# Patient Record
Sex: Male | Born: 1995
Health system: Southern US, Community
[De-identification: ages and names within clinical notes are randomized; demographics above are authoritative.]

## PROBLEM LIST (undated history)

## (undated) DIAGNOSIS — J45909 Unspecified asthma, uncomplicated: Secondary | ICD-10-CM

## (undated) DIAGNOSIS — T7840XA Allergy, unspecified, initial encounter: Secondary | ICD-10-CM

## (undated) HISTORY — PX: CLEFT PALATE REPAIR: SUR1165

## (undated) HISTORY — PX: TYMPANOSTOMY TUBE PLACEMENT: SHX32

## (undated) HISTORY — DX: Unspecified asthma, uncomplicated: J45.909

## (undated) HISTORY — DX: Allergy, unspecified, initial encounter: T78.40XA

---
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
## 2014-03-03 ENCOUNTER — Ambulatory Visit (INDEPENDENT_AMBULATORY_CARE_PROVIDER_SITE_OTHER): Payer: 59 | Admitting: Family Medicine

## 2014-03-03 VITALS — BP 148/76 | HR 81 | Temp 98.1°F | Resp 20 | Ht 71.0 in | Wt 230.0 lb

## 2014-03-03 DIAGNOSIS — M25579 Pain in unspecified ankle and joints of unspecified foot: Secondary | ICD-10-CM

## 2014-03-03 DIAGNOSIS — M25572 Pain in left ankle and joints of left foot: Secondary | ICD-10-CM

## 2014-03-03 DIAGNOSIS — T148XXA Other injury of unspecified body region, initial encounter: Secondary | ICD-10-CM

## 2014-03-03 NOTE — Patient Instructions (Signed)
Acute Ankle Sprain with Phase II Rehab An acute ankle sprain is a partial or complete tear in one or more of the ligaments of the ankle due to traumatic injury. The severity of the injury depends on both the number of ligaments sprained and the grade of sprain. There are 3 grades of sprains.  A grade 1 sprain is a mild sprain. There is a slight pull without obvious tearing. There is no loss of strength, and the muscle and ligament are the correct length.  A grade 2 sprain is a moderate sprain. There is tearing of fibers within the substance of the ligament where it connects two bones or two cartilages. The length of the ligament is increased, and there is usually decreased strength.  A grade 3 sprain is a complete rupture of the ligament and is uncommon. In addition to the grade of sprain, there are 3 types of ankle sprains.  Lateral ankle sprains. This is a sprain of one or more of the 3 ligaments on the outer side (lateral) of the ankle. These are the most common sprains. Medial ankle sprains. There is one large triangular ligament on the inner side (medial) of the ankle that is susceptible to injury. Medial ankle sprains are less common. Syndesmosis, "high ankle," sprains. The syndesmosis is the ligament that connects the two bones of the lower leg. Syndesmosis sprains usually only occur with very severe ankle sprains. SYMPTOMS  Pain, tenderness, and swelling in the ankle, starting at the side of injury that may progress to the whole ankle and foot with time.  "Pop" or tearing sensation at the time of injury.  Bruising that may spread to the heel.  Impaired ability to walk soon after injury. CAUSES   Acute ankle sprains are caused by trauma placed on the ankle that temporarily forces or pries the anklebone (talus) out of its normal socket.  Stretching or tearing of the ligaments that normally hold the joint in place (usually due to a twisting injury). RISK INCREASES WITH:  Previous  ankle sprain.  Sports in which the foot may land awkwardly (basketball, volleyball, soccer) or walking or running on uneven or rough surfaces.  Shoes with inadequate support to prevent sideways motion when stress occurs.  Poor strength and flexibility.  Poor balance skills.  Contact sports. PREVENTION  Warm up and stretch properly before activity.  Maintain physical fitness:  Ankle and leg flexibility, muscle strength, and endurance.  Cardiovascular fitness.  Balance training activities.  Use proper technique and have a coach correct improper technique.  Taping, protective strapping, bracing, or high-top tennis shoes may help prevent injury. Initially, tape is best. However, it loses most of its support function within 10 to 15 minutes.  Wear proper fitted protective shoes. Combining high-top shoes with taping or bracing is more effective than using either alone.  Provide the ankle with support during sports and practice activities for 12 months following injury. PROGNOSIS   If treated properly, ankle sprains can be expected to recover completely. However, the length of recovery depends on the degree of injury.  A grade 1 sprain usually heals enough in 5 to 7 days to allow modified activity and requires an average of 6 weeks to heal completely.  A grade 2 sprain requires 6 to 10 weeks to heal completely.  A grade 3 sprain requires 12 to 16 weeks to heal.  A syndesmosis sprain often takes more than 3 months to heal. RELATED COMPLICATIONS   Frequent recurrence of symptoms may result   in a chronic problem. Appropriately addressing the problem the first time decreases the frequency of recurrence and optimizes healing time. Severity of initial sprain does not predict the likelihood of later instability.  Injury to other structures (bone, cartilage, or tendon).  Chronically unstable or arthritic ankle joint are possible with repeated sprains. TREATMENT Treatment initially  involves the use of ice, medicine, and compression bandages to help reduce pain and inflammation. Ankle sprains are usually immobilized in a walking cast or boot to allow for healing. Crutches may be recommended to reduce pressure on the injury. After immobilization, strengthening and stretching exercises may be necessary to regain strength and a full range of motion. Surgery is rarely needed to treat ankle sprains. MEDICATION   Nonsteroidal anti-inflammatory medicines, such as aspirin and ibuprofen (do not take for the first 3 days after injury or within 7 days before surgery), or other minor pain relievers, such as acetaminophen, are often recommended. Take these as directed by your caregiver. Contact your caregiver immediately if any bleeding, stomach upset, or signs of an allergic reaction occur from these medicines.  Ointments applied to the skin may be helpful.  Pain relievers may be prescribed as necessary by your caregiver. Do not take prescription pain medicine for longer than 4 to 7 days. Use only as directed and only as much as you need. HEAT AND COLD  Cold treatment (icing) is used to relieve pain and reduce inflammation for acute and chronic cases. Cold should be applied for 10 to 15 minutes every 2 to 3 hours for inflammation and pain and immediately after any activity that aggravates your symptoms. Use ice packs or an ice massage.  Heat treatment may be used before performing stretching and strengthening activities prescribed by your caregiver. Use a heat pack or a warm soak. SEEK IMMEDIATE MEDICAL CARE IF:   Pain, swelling, or bruising worsens despite treatment.  You experience pain, numbness, discoloration, or coldness in the foot or toes.  New, unexplained symptoms develop. (Drugs used in treatment may produce side effects.) EXERCISES  PHASE II EXERCISES RANGE OF MOTION (ROM) AND STRETCHING EXERCISES - Ankle Sprain, Acute-Phase II, Weeks 3 to 4 After your physician, physical  therapist, or athletic trainer feels your knee has made progress significant enough to begin more advanced exercises, he or she may recommend completing some of the following exercises. Although each person heals at different rates, most people will be ready for these exercises between 3 and 4 weeks after their injury. Do not begin these exercises until you have your caregiver's permission. He or she may also advise you to continue with the exercises which you completed in Phase I of your rehabilitation. While completing these exercises, remember:   Restoring tissue flexibility helps normal motion to return to the joints. This allows healthier, less painful movement and activity.  An effective stretch should be held for at least 30 seconds.  A stretch should never be painful. You should only feel a gentle lengthening or release in the stretched tissue. RANGE OF MOTION - Ankle Plantar Flexion   Sit with your right / left leg crossed over your opposite knee.  Use your opposite hand to pull the top of your foot and toes toward you.  You should feel a gentle stretch on the top of your foot/ankle. Hold this position for __________. Repeat __________ times. Complete __________ times per day.  RANGE OF MOTION - Ankle Eversion  Sit with your right / left ankle crossed over your opposite knee.    Grip your foot with your opposite hand, placing your thumb on the top of your foot and your fingers across the bottom of your foot.  Gently push your foot downward with a slight rotation so your littlest toes rise slightly  You should feel a gentle stretch on the inside of your ankle. Hold the stretch for __________ seconds. Repeat __________ times. Complete this exercise __________ times per day.  RANGE OF MOTION - Ankle Inversion  Sit with your right / left ankle crossed over your opposite knee.  Grip your foot with your opposite hand, placing your thumb on the bottom of your foot and your fingers across  the top of your foot.  Gently pull your foot so the smallest toe comes toward you and your thumb pushes the inside of the ball of your foot away from you.  You should feel a gentle stretch on the outside of your ankle. Hold the stretch for __________ seconds. Repeat __________ times. Complete this exercise __________ times per day.  STRETCH - Gastrocsoleus  Sit with your right / left leg extended. Holding onto both ends of a belt or towel, loop it around the ball of your foot.  Keeping your right / left ankle and foot relaxed and your knee straight, pull your foot and ankle toward you using the belt/towel.  You should feel a gentle stretch behind your calf or knee. Hold this position for __________ seconds. Repeat __________ times. Complete this stretch __________ times per day.  RANGE OF MOTION - Ankle Dorsiflexion, Active Assisted  Remove shoes and sit on a chair that is preferably not on a carpeted surface.  Place right / left foot under knee. Extend your opposite leg for support.  Keeping your heel down, slide your right / left foot back toward the chair until you feel a stretch at your ankle or calf. If you do not feel a stretch, slide your bottom forward to the edge of the chair while still keeping your heel down.  Hold this stretch for __________ seconds. Repeat __________ times. Complete this stretch __________ times per day.  STRETCH - Gastroc, Standing   Place hands on wall.  Extend right / left leg and place a folded washcloth under the arch of your foot for support. Keep the front knee somewhat bent.  Slightly point your toes inward on your back foot.  Keeping your right / left heel on the floor and your knee straight, shift your weight toward the wall, not allowing your back to arch.  You should feel a gentle stretch in the calf. Hold this position for __________ seconds. Repeat __________ times. Complete this stretch __________ times per day. STRETCH - Soleus,  Standing  Place hands on wall.  Extend right / left leg and place a folded washcloth under the arch of your foot for support. Keep the front knee somewhat bent.  Slightly point your toes inward on your back foot.  Keep your right / left heel on the floor, bend your back knee, and slightly shift your weight over the back leg so that you feel a gentle stretch deep in your back calf.  Hold this position for __________ seconds. Repeat __________ times. Complete this stretch __________ times per day. STRETCH - Gastrocsoleus, Standing Note: This exercise can place a lot of stress on your foot and ankle. Please complete this exercise only if specifically instructed by your caregiver.   Place the ball of your right / left foot on a step, keeping your other   foot firmly on the same step.  Hold on to the wall or a rail for balance.  Slowly lift your other foot, allowing your body weight to press your heel down over the edge of the step.  You should feel a stretch in your right / left calf.  Hold this position for __________ seconds.  Repeat this exercise with a slight bend in your knee. Repeat __________ times. Complete this stretch __________ times per day.  STRENGTHENING EXERCISES - Ankle Sprain, Acute-Phase II Around 3 to 4 weeks after your injury, you may progress to some of these exercises in your rehabilitation program. Do not begin these until you have your caregiver's permission. Although your condition has improved, the Phase I exercises will continue to be helpful and you may continue to complete them. As you complete strengthening exercises, remember:   Strong muscles with good endurance tolerate stress better.  Do the exercises as initially prescribed by your caregiver. Progress slowly with each exercise, gradually increasing the number of repetitions and weight used under his or her guidance.  You may experience muscle soreness or fatigue, but the pain or discomfort you are trying  to eliminate should never worsen during these exercises. If this pain does worsen, stop and make certain you are following the directions exactly. If the pain is still present after adjustments, discontinue the exercise until you can discuss the trouble with your caregiver. STRENGTH - Plantar-flexors, Standing  Stand with your feet shoulder width apart. Steady yourself with a wall or table using as little support as needed.  Keeping your weight evenly spread over the width of your feet, rise up on your toes.*  Hold this position for __________ seconds. Repeat __________ times. Complete this exercise __________ times per day.  *If this is too easy, shift your weight toward your right / left leg until you feel challenged. Ultimately, you may be asked to do this exercise with your right / left foot only. STRENGTH - Dorsiflexors and Plantar-flexors, Heel/toe Walking  Dorsiflexion: Walk on your heels only. Keep your toes as high as possible.  Walk for ____________________ seconds/feet.  Repeat __________ times. Complete __________ times per day.  Plantar flexion: Walk on your toes only. Keep your heels as high as possible.  Walk for ____________________ seconds/feet. Repeat __________ times. Complete __________ times per day.  BALANCE - Tandem Walking  Place your uninjured foot on a line 2 to 4 inches wide and at least 10 feet long.  Keeping your balance without using anything for extra support, place your right / left heel directly in front of your other foot.  Slowly raise your back foot up, lifting from the heel to the toes, and place it directly in front of the right / left foot.  Continue to walk along the line slowly. Walk for ____________________ feet. Repeat ____________________ times. Complete ____________________ times per day. BALANCE - Inversion/Eversion Use caution, these are advanced level exercises. Do not begin them until you are advised to do so.   Create a balance  board using a sturdy board about 1  feet long and at 1 to 1  feet wide and a 1  inch diameter rod or pipe that is as long as the board's width. A copper pipe or a solid broomstick work well.  Stand on a non-carpeted surface near a countertop or wall. Step onto the board so that your feet are hip-width apart and equally straddle the rod/pipe.  Keeping your feet in place, complete these two exercises   without shifting your upper body or hips:  Tip the board from side-to-side. Control the movement so the board does not forcefully strike the ground. The board should silently tap the ground.  Tip the board side-to-side without striking the ground. Occasionally pause and maintain a steady position at various points.  Repeat the first two exercises, but use only your right / left foot. Place your right / left foot directly over the rod/pipe. Repeat __________ times. Complete this exercise __________ times a day. BALANCE - Plantar/Dorsi Flexion Use caution, these are advanced level exercises. Do not begin them until you are advised to do so.   Create a balance board using a sturdy board about 1  feet long and at 1 to 1  feet wide and a 1  inch diameter rod or pipe that is as long as the board's width. A copper pipe or a solid broomstick work well.  Stand on a non-carpeted surface near a countertop or wall. Stand on the board so that the rod/pipe runs under the arches in your feet.  Keeping your feet in place, complete these two exercises without shifting your upper body or hips:  Tip the board from side-to-side. Control the movement so the board does not forcefully strike the ground. The board should silently tap the ground.  Tip the board side-to-side without striking the ground. Occasionally pause and maintain a steady position at various points.  Repeat the first two exercises, but use only your right / left foot. Stand in the center of the board. Repeat __________ times. Complete this  exercise __________ times a day. STRENGTH - Plantar-flexors, Eccentric Note: This exercise can place a lot of stress on your foot and ankle. Please complete this exercise only if specifically instructed by your caregiver.   Place the balls of your feet on a step. With your hands, use only enough support from a wall or rail to keep your balance.  Keep your knees straight and rise up on your toes.  Slowly shift your weight entirely to your toes and pick up your opposite foot. Gently and with controlled movement, lower your weight through your right / left foot so that your heel drops below the level of the step. You will feel a slight stretch in the back of your calf at the ending position.  Use the healthy leg to help rise up onto the balls of both feet, then lower weight only on the right / left leg again. Build up to 15 repetitions. Then progress to 3 consecutive sets of 15 repetitions.*  After completing the above exercise, complete the same exercise with a slight knee bend (about 30 degrees). Again, build up to 15 repetitions. Then progress to 3 consecutive sets of 15 repetitions.* Perform this exercise __________ times per day.  *When you easily complete 3 sets of 15, your physician, physical therapist, or athletic trainer may advise you to add resistance by wearing a backpack filled with additional weight. Document Released: 12/11/2005 Document Revised: 11/13/2011 Document Reviewed: 12/03/2008 [employer] Patient Information 2015 [employer]. This information is not intended to replace advice given to you by your health care provider. Make sure you discuss any questions you have with your health care provider.  

## 2014-03-03 NOTE — Progress Notes (Signed)
Chief Complaint:  Chief Complaint  Patient presents with  . Ankle Pain    Left ankle pain, came in a while back due to ankle pain, still having pain and discomfort in that ankle    HPI: Michael Santos is a 18 y.o. male who is here for left ankle pain, has intemittent pain on certain days. He has no numbness/weakness, tinglgin He has no fevers , chills, no current swelling, no current pain. He states that on days he overuses it the ankle will swell and hurt on the medial side. He had has this since his ankle sprain in Nov. The pain is not the same as it was then, just kind of achey if he uses it a lot. He wear high top boots   Foot and ankle xrays were both negative,   CLINICAL DATA: Left medial ankle and foot pain after twisting  injury  EXAM:  LEFT ANKLE COMPLETE - 3+ VIEW  COMPARISON: None.  FINDINGS:  There is no evidence of fracture, dislocation, or joint effusion.  There is no evidence of arthropathy or other focal bone abnormality.  Soft tissues are unremarkable.  IMPRESSION:  Negative.  Electronically Signed  By: Esperanza Heiraymond Rubner M.D.  On: 07/27/2013 17:36   CLINICAL DATA: Left medial ankle and foot pain after twisting  injury  EXAM:  LEFT FOOT - COMPLETE 3+ VIEW  COMPARISON: None.  FINDINGS:  There is no evidence of fracture or dislocation. There is no  evidence of arthropathy or other focal bone abnormality. Soft  tissues are unremarkable.  IMPRESSION:  Negative.  Electronically Signed  By: Esperanza Heiraymond Rubner M.D.  On: 07/27/2013 17:36    Past Medical History  Diagnosis Date  . Allergy   . Asthma    Past Surgical History  Procedure Laterality Date  . Cleft palate repair     History   Social History  . Marital Status: Single    Spouse Name: N/A    Number of Children: N/A  . Years of Education: N/A   Social History Main Topics  . Smoking status: Never Smoker   . Smokeless tobacco: None  . Alcohol Use: None  . Drug Use: None  . Sexual  Activity: None   Other Topics Concern  . None   Social History Narrative  . None   Family History  Problem Relation Age of Onset  . Cleft palate Father   . Cleft palate Paternal Grandfather    No Known Allergies Prior to Admission medications   Medication Sig Start Date End Date Taking? Authorizing Provider  albuterol (PROVENTIL HFA;VENTOLIN HFA) 108 (90 BASE) MCG/ACT inhaler Inhale 2 puffs into the lungs every 6 (six) hours as needed.   Yes Historical Provider, MD  Azelastine-Fluticasone (DYMISTA NA) Place into the nose.   Yes Historical Provider, MD  cetirizine (ZYRTEC) 10 MG tablet Take 10 mg by mouth daily.   Yes Historical Provider, MD  fluticasone-salmeterol (ADVAIR HFA) 115-21 MCG/ACT inhaler Inhale 2 puffs into the lungs 2 (two) times daily.   Yes Historical Provider, MD  ipratropium (ATROVENT) 0.03 % nasal spray Place 2 sprays into the nose 2 (two) times daily. 09/08/13  Yes Eleanore E Debbra RidingEgan, PA-C     ROS: The patient denies fevers, chills, night sweats, unintentional weight loss, chest pain, palpitations, wheezing, dyspnea on exertion, nausea, vomiting, abdominal pain, dysuria, hematuria, melena, numbness, weakness, or tingling.   All other systems have been reviewed and were otherwise negative with the exception of those mentioned  in the HPI and as above.    PHYSICAL EXAM: Filed Vitals:   03/03/14 0832  BP: 148/76  Pulse: 81  Temp: 98.1 F (36.7 C)  Resp: 20   Filed Vitals:   03/03/14 0832  Height: 5\' 11"  (1.803 m)  Weight: 230 lb (104.327 kg)   Body mass index is 32.09 kg/(m^2).  General: Alert, no acute distress HEENT:  Normocephalic, atraumatic, oropharynx patent. EOMI, PERRLA Cardiovascular:  Regular rate and rhythm, no rubs murmurs or gallops.  Radial pulse intact. No pedal edema.  Respiratory: Clear to auscultation bilaterally.  No wheezes, rales, or rhonchi.  No cyanosis, no use of accessory musculature GI: No organomegaly, abdomen is soft and  non-tender, positive bowel sounds.  No masses. Skin: No rashes. Neurologic: Facial musculature symmetric. Psychiatric: Patient is appropriate throughout our interaction. Lymphatic: No cervical lymphadenopathy Musculoskeletal: Gait intact. Left medial ankle-normal ankle, 5/5 str, sensation intact, neg anterior drawer.  + DP    LABS: No results found for this or any previous visit.   EKG/XRAY:   [facility]   ASSESSMENT/PLAN: Encounter Diagnoses  Name Primary?  . Pain in joint, ankle and foot, left Yes  . Sprain and strain    ROM exercises,a dvise to wear ace wrap or ankle brace prn Tylenol or motrin prin RICE after use prn Offered xrays but patient declined F/u prn  Gross sideeffects, risk and benefits, and alternatives of medications d/w patient. Patient is aware that all medications have potential sideeffects and we are unable to predict every sideeffect or drug-drug interaction that may occur.  Hamilton CapriLE, THAO PHUONG, DO 03/05/2014 3:40 PM

## 2014-03-27 ENCOUNTER — Ambulatory Visit (INDEPENDENT_AMBULATORY_CARE_PROVIDER_SITE_OTHER): Payer: 59 | Admitting: Family Medicine

## 2014-03-27 VITALS — BP 122/80 | HR 76 | Temp 98.5°F | Resp 16 | Ht 71.0 in | Wt 229.4 lb

## 2014-03-27 DIAGNOSIS — L600 Ingrowing nail: Secondary | ICD-10-CM

## 2014-03-27 MED ORDER — CEPHALEXIN 500 MG PO CAPS: 500.0000 mg | ORAL_CAPSULE | Freq: Three times a day (TID) | ORAL | Status: DC

## 2014-03-27 NOTE — Patient Instructions (Signed)
Use the keflex antibiotic as directed.  Continue to keep the ingrown edge free of the skin.  Let me know if you are not getting better- in that case we should have you come in for a partial nail resection.

## 2014-03-27 NOTE — Progress Notes (Signed)
[facility] [address], [place] [id number] [phone]  Date:  03/27/2014   Name:  Michael Santos   DOB:  [birth year]   MRN:  [id number]  PCP:  Tonye PearsonOLITTLE, ROBERT P, MD    Chief Complaint: Left Toe Infection   History of Present Illness:  Michael Santos is a 18 y.o. very pleasant male patient who presents with the following:  He is here today with a problem with his left great toe. He has had this issue for about 2 weeks. The nail seemed to get ingrown, and then got dirty from a pair of boots.  He was able to pull out the ingrown area, but the area still seems to be infected.  He gets this sort of problem on occasion, but usually self- resolves. However he feels that this time the infection is not going away.    Otherwise generally heatlhy and feeling well. No fever or other systemic sx.    Patient Active Problem List   Diagnosis Date Noted  . Allergic rhinitis 06/11/2013  . Reactive airway disease 06/11/2013    Past Medical History  Diagnosis Date  . Allergy   . Asthma     Past Surgical History  Procedure Laterality Date  . Cleft palate repair      History  Substance Use Topics  . Smoking status: Never Smoker   . Smokeless tobacco: Not on file  . Alcohol Use: Not on file    Family History  Problem Relation Age of Onset  . Cleft palate Father   . Cleft palate Paternal Grandfather     No Known Allergies  Medication list has been reviewed and updated.  Current Outpatient Prescriptions on File Prior to Visit  Medication Sig Dispense Refill  . albuterol (PROVENTIL HFA;VENTOLIN HFA) 108 (90 BASE) MCG/ACT inhaler Inhale 2 puffs into the lungs every 6 (six) hours as needed.      . Azelastine-Fluticasone (DYMISTA NA) Place into the nose.      . cetirizine (ZYRTEC) 10 MG tablet Take 10 mg by mouth daily.      . fluticasone-salmeterol (ADVAIR HFA) 115-21 MCG/ACT inhaler Inhale 2 puffs into the lungs 2 (two) times daily.      Marland Kitchen. ipratropium  (ATROVENT) 0.03 % nasal spray Place 2 sprays into the nose 2 (two) times daily.  30 mL  1   No current facility-administered medications on file prior to visit.    Review of Systems:  As per HPI- otherwise negative.   Physical Examination: Filed Vitals:   03/27/14 1110  BP: 122/80  Pulse: 76  Temp: 98.5 F (36.9 C)  Resp: 16   Filed Vitals:   03/27/14 1110  Height: 5\' 11"  (1.803 m)  Weight: 229 lb 6.4 oz (104.055 kg)   Body mass index is 32.01 kg/(m^2). Ideal Body Weight: Weight in (lb) to have BMI = 25: 178.9  GEN: WDWN, NAD, Non-toxic, A & O x 3, overweight, looks well HEENT: Atraumatic, Normocephalic. Neck supple. No masses, No LAD. Ears and Nose: No external deformity. CV: RRR, No M/G/R. No JVD. No thrill. No extra heart sounds. PULM: CTA B, no wheezes, crackles, rhonchi. No retractions. No resp. distress. No accessory muscle use. EXTR: No c/c/e NEURO Normal gait.  PSYCH: Normally interactive. Conversant. Not depressed or anxious appearing.  Calm demeanor.  Left great toenail: ingrown at the lateral edge. He has cut back the ingrown edge, but there is still a scant amount of purulent discharge  and tenderness   Assessment and Plan: Ingrown left big toenail - Plan: cephALEXin (KEFLEX) 500 MG capsule  Treat for infected nail with keflex- he will keep a close eye on this.  He does not prefer to have a wedge resection today, but will come back if not getting better   Signed Abbe Amsterdam, MD

## 2014-05-08 ENCOUNTER — Ambulatory Visit (INDEPENDENT_AMBULATORY_CARE_PROVIDER_SITE_OTHER): Payer: 59

## 2014-05-08 ENCOUNTER — Ambulatory Visit (INDEPENDENT_AMBULATORY_CARE_PROVIDER_SITE_OTHER): Payer: 59 | Admitting: Family Medicine

## 2014-05-08 VITALS — BP 146/76 | HR 80 | Temp 98.2°F | Resp 20 | Ht 70.75 in | Wt 235.8 lb

## 2014-05-08 DIAGNOSIS — M25571 Pain in right ankle and joints of right foot: Secondary | ICD-10-CM

## 2014-05-08 DIAGNOSIS — M25579 Pain in unspecified ankle and joints of unspecified foot: Secondary | ICD-10-CM

## 2014-05-08 DIAGNOSIS — M25572 Pain in left ankle and joints of left foot: Principal | ICD-10-CM

## 2014-05-08 DIAGNOSIS — S93409A Sprain of unspecified ligament of unspecified ankle, initial encounter: Secondary | ICD-10-CM

## 2014-05-08 DIAGNOSIS — S93402A Sprain of unspecified ligament of left ankle, initial encounter: Secondary | ICD-10-CM

## 2014-05-08 DIAGNOSIS — S93401A Sprain of unspecified ligament of right ankle, initial encounter: Secondary | ICD-10-CM

## 2014-05-08 DIAGNOSIS — L600 Ingrowing nail: Secondary | ICD-10-CM

## 2014-05-08 NOTE — Patient Instructions (Addendum)
Ibuprofen or alleve if needed, brace to right ankle as needed, walking boot on left until recheck in next week.  Out of braces for range of motion and early movement as below. Return to the clinic or go to the nearest emergency room if any of your symptoms worsen or new symptoms occur. Other information below to lessen ingrown toenail formation in the future.   INGROWN TOENAIL . Keep area clean, dry and bandaged for 24 hours. . After 24 hours, remove outer bandage and leave yellow gauze in place. Nuala Alpha toe/foot in warm soapy water for 5-10 minutes, once daily for 5 days. Rebandage toe after each cleaning. . Continue soaks until yellow gauze falls off. . Notify the office if you experience any of the following signs of infection: Swelling, redness, pus drainage, streaking, fever > 101.0 F

## 2014-05-08 NOTE — Progress Notes (Signed)
PROCEDURE: Verbal consent obtained. Digital block with 4 cc 2% lidocaine plain.  SP&D.  Lateral nail lifted and removed.  Xeroform placed.  Cleansed and dressed.

## 2014-05-08 NOTE — Progress Notes (Addendum)
Subjective:    Patient ID: Michael Santos, male    DOB: 02-17-1996, 18 y.o.   MRN: [id number]  This chart was scribed for Meredith Staggers, MD by Gwenevere Abbot, ED scribe. This patient was seen in room Room/bed 11 and the patient's care was started at 2:10 PM.  WUJ:[id number], Harrel Lemon, MD  Chief Complaint  Patient presents with  . Ankle Pain    twisted Both Ankle x 1 week ago  . Ingrown Toenail    Lt Great toe   HPI Michael Santos is a 18 y.o. male who presents to [facility] with bilateral ankle pain and ingrown toenail.  Pt has a h/o ankle pain and sprain in the past, seen most recently 03/03/2014. Also concerns of an ingrown toenail and was seen by Dr. Domenic Schwab on 03/27/2014. Pt was treated with Keflex, and advised to keep ingrown nail free of the skin as he declined a wedge resection that day.   Right ingrown toenail. Pt reports that he finished antibiotic, however it seemed it worsened again afterwards. Pt reports that he attempted to treat with antibiotic ointment. Pt reports that one of his boots may be one of the issues due to exposure to dirt and manuer.   Bilateral Ankles: Pt reports that he injured ankles 6 days ago, and inverted right ankle, then rolled left ankle after stepping off of something at house.     Patient Active Problem List   Diagnosis Date Noted  . Allergic rhinitis 06/11/2013  . Reactive airway disease 06/11/2013   Past Medical History  Diagnosis Date  . Allergy   . Asthma    Past Surgical History  Procedure Laterality Date  . Cleft palate repair     No Known Allergies Prior to Admission medications   Medication Sig Start Date End Date Taking? Authorizing Provider  albuterol (PROVENTIL HFA;VENTOLIN HFA) 108 (90 BASE) MCG/ACT inhaler Inhale 2 puffs into the lungs every 6 (six) hours as needed.   Yes Historical Provider, MD  Azelastine-Fluticasone (DYMISTA NA) Place into the nose.   Yes Historical Provider, MD  cetirizine (ZYRTEC) 10 MG tablet Take 10 mg  by mouth daily.   Yes Historical Provider, MD  fluticasone-salmeterol (ADVAIR HFA) 115-21 MCG/ACT inhaler Inhale 2 puffs into the lungs 2 (two) times daily.   Yes Historical Provider, MD  ibuprofen (ADVIL,MOTRIN) 800 MG tablet Take 800 mg by mouth every 8 (eight) hours as needed.   Yes Historical Provider, MD   History   Social History  . Marital Status: Single    Spouse Name: N/A    Number of Children: N/A  . Years of Education: N/A   Occupational History  . Not on file.   Social History Main Topics  . Smoking status: Never Smoker   . Smokeless tobacco: Not on file  . Alcohol Use: No  . Drug Use: No  . Sexual Activity: Not on file   Other Topics Concern  . Not on file   Social History Narrative  . No narrative on file    Review of Systems  Musculoskeletal: Positive for arthralgias and myalgias.  Skin:       Ingrown toenail       Objective:   Physical Exam  Nursing note and vitals reviewed. Constitutional: He is oriented to person, place, and time. He appears well-developed and well-nourished.  HENT:  Head: Normocephalic and atraumatic.  Eyes: EOM are normal.  Neck: Normal range of motion. Neck supple.  Cardiovascular: Normal rate.  Pulmonary/Chest: Effort normal.  Musculoskeletal: Normal range of motion.       Right ankle: Achilles tendon exhibits normal Thompson's test results.       Left ankle: Achilles tendon exhibits normal Thompson's test results.  Left Ankle: Fibula is non tender. Negative squeeze test. Complains of most pain on distal achilles of left ankle. Negative thompson test on left ankle. No step off of the achilles. No apparent defect. Calves are non tender. Tender on medial malleolus of left and lateral malleolus. Additionally tender on the anterior ankle. Slight soft tissue swelling laterally.Some laxity with drawer, and talar tilt testing  Right Ankle: Negative squeeze on lower leg. Fibula non tender. Medial malleolus tenderness. Full strength  and ROM. Tenderness with some soft tissue swelling laterally, slight laxity with talar tilt testing.   Able to dorsi and plantar flex. Able to evert and invert bilateral ankles. NVI distally.  Left Great Toe: Erythema, soft tissue swelling, with granulation tissue, at the medial nail fold. Some dried blood but no active discharge.   Neurological: He is alert and oriented to person, place, and time.  Skin: Skin is warm and dry.  Psychiatric: He has a normal mood and affect. His behavior is normal.    Imaging: Interpreted by Dr Neva Seat: Bilateral ankles are negative for fracture and no acute findings in both.   Filed Vitals:   05/08/14 1317  BP: 146/76  Pulse: 80  Temp: 98.2 F (36.8 C)  TempSrc: Oral  Resp: 20  Height: 5' 10.75" (1.797 m)  Weight: 235 lb 12.8 oz (106.958 kg)  SpO2: 100%       Assessment & Plan:   Michael Santos is a 18 y.o. male Bilateral ankle pain, Right ankle sprain, initial encounter, Left ankle sprain, initial encounter  -no apparent fx's. Left greater than right pain, into ankle.  Suspect some component of deconditioning. Cam walker on L, sweedo on right and recehck next week as somewhat guarded exam initially. ROM with ankle alphabet initially.   Ingrown toenail  -removed per procedure note and aftercare discussed. Discussed preventative techniques in future.   Meds ordered this encounter  Medications  . ibuprofen (ADVIL,MOTRIN) 800 MG tablet    Sig: Take 800 mg by mouth every 8 (eight) hours as needed.   Patient Instructions  Ibuprofen or alleve if needed, brace to right ankle as needed, walking boot on left until recheck in next week.  Out of braces for range of motion and early movement as below. Return to the clinic or go to the nearest emergency room if any of your symptoms worsen or new symptoms occur. Other information below to lessen ingrown toenail formation in the future.   INGROWN TOENAIL . Keep area clean, dry and bandaged for 24  hours. . After 24 hours, remove outer bandage and leave yellow gauze in place. Nuala Alpha toe/foot in warm soapy water for 5-10 minutes, once daily for 5 days. Rebandage toe after each cleaning. . Continue soaks until yellow gauze falls off. . Notify the office if you experience any of the following signs of infection: Swelling, redness, pus drainage, streaking, fever > 101.0 F       I personally performed the services described in this documentation, which was scribed in my presence. The recorded information has been reviewed and considered, and addended by me as needed.

## 2014-06-17 ENCOUNTER — Encounter: Payer: Self-pay | Admitting: Internal Medicine

## 2014-06-17 ENCOUNTER — Other Ambulatory Visit: Payer: Self-pay | Admitting: Internal Medicine

## 2014-06-17 DIAGNOSIS — H6691 Otitis media, unspecified, right ear: Secondary | ICD-10-CM

## 2014-06-17 MED ORDER — AMOXICILLIN 500 MG PO CAPS
1000.0000 mg | ORAL_CAPSULE | Freq: Two times a day (BID) | ORAL | Status: AC
Start: 2014-06-17 — End: 2014-06-27

## 2014-07-23 ENCOUNTER — Ambulatory Visit (INDEPENDENT_AMBULATORY_CARE_PROVIDER_SITE_OTHER): Payer: 59 | Admitting: Physician Assistant

## 2014-07-23 VITALS — BP 128/80 | HR 86 | Temp 98.5°F | Resp 18 | Wt 238.0 lb

## 2014-07-23 DIAGNOSIS — L600 Ingrowing nail: Secondary | ICD-10-CM

## 2014-07-23 NOTE — Progress Notes (Signed)
Subjective:    Patient ID: Michael Santos, male    DOB: 02/13/1996, 18 y.o.   MRN: [id number]  Chief Complaint  Patient presents with  . Ingrown Toenail    HPI 18 year old male is here for complaint of toe pain at his right foot at the 1st-3rd nails.  Patient has a hx of ingrown toenails that have been troublesome for months.  He usually relieves this by having his brother pull and clip the nails with nail tools.  However for the last 1-2 weeks the toes have extremely hurt.  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  He says that the 2nd toe has redness in the last week and is more pain ful than the others.  He denies fever, bleeding or pus.    Past Medical History  Diagnosis Date  . Allergy   . Asthma    Family History  Problem Relation Age of Onset  . Cleft palate Father   . Hyperlipidemia Father   . Hypertension Father   . Asthma Father   . COPD Father   . Cleft palate Paternal Grandfather   . Hypertension Mother   . Depression Mother    Past Surgical History  Procedure Laterality Date  . Cleft palate repair     Medications Current outpatient prescriptions: albuterol (PROVENTIL HFA;VENTOLIN HFA) 108 (90 BASE) MCG/ACT inhaler, Inhale 2 puffs into the lungs every 6 (six) hours as needed., Disp: , Rfl: ;  Azelastine-Fluticasone (DYMISTA NA), Place into the nose., Disp: , Rfl: ;  cetirizine (ZYRTEC) 10 MG tablet, Take 10 mg by mouth daily., Disp: , Rfl:  fluticasone-salmeterol (ADVAIR HFA) 115-21 MCG/ACT inhaler, Inhale 2 puffs into the lungs 2 (two) times daily., Disp: , Rfl: ;  ibuprofen (ADVIL,MOTRIN) 800 MG tablet, Take 800 mg by mouth every 8 (eight) hours as needed., Disp: , Rfl:   Review of Systems  Constitutional: Negative for fever.  Musculoskeletal: Negative for joint pain.        Objective:   Physical Exam  Constitutional: He is oriented to person, place, and time. He appears well-developed  and well-nourished. No distress.  BP 128/80 mmHg  Pulse 86  Temp(Src) 98.5 F (36.9 C) (Oral)  Resp 18  Wt 238 lb (107.956 kg)  SpO2 100%   Cardiovascular: Normal rate.   Pulmonary/Chest: Effort normal and breath sounds normal. No respiratory distress.  Musculoskeletal: He exhibits no edema.  Tenderness to palpaton of right foot at the lateral side of 1st toe along skin next to nail bed.  No erythema.  Cutting with sharp angle near proximal part of nail bed.  Tenderness to palpation. Right foot 3rd toe tender to palpation along lateral side of nail bed.  Cut on both sides of nail.  No erythema.   Neurological: He is alert and oriented to person, place, and time.  Skin: Skin is warm and dry. No rash noted. There is erythema (2nd toe has erythema at lateral side of nail bed.  Toe nail looks cut away from lateral side the toenail.  Can visualize sharp angle where it has been cut.  No drainage.  Tenderness to palpation.).  Psychiatric: He has a normal mood and affect. His behavior is normal. Judgment and thought content normal.  Nursing note and vitals reviewed.   Procedure: Consent obtained.  Injected 50/50 1%Lidocaine and 1%Marcaine for anesthesia.  Cleaned with Povidine.  Clipped lateral side of nail  bed of 1st and 2nd toe.  Swept underneath the cuticle and across nail bed to ensure entire nail was removed.  Placed xeroform across exposed nailbed of 1st and 2nd toe of the right foot.  Dressings applied.       Assessment & Plan:  18 year old with hx of ingrown toenails is here with complaint of toe pain at 1st through third nails.  These were ingrown toenails.  Clipped the aggravating sides to grow back smoothly, and reduce possibility of toenail growing into the toe skin.  Discussed that the patient not have the brother clip the nails and displayed how the toe would grow back into the skin in a more painful fashion.  Patient agreed, and will seek treatment if the toenail becomes painful again.     Ingrown right big toenail Ingrown toenail 1st and 2nd lateral portion of nail removed and xeroform placed.  These did not seem to be infected at this time to needing an antibiotic.  Instructed patient on care.   Patient will follow up if pain does not improve.    Trena PlattStephanie Norene Oliveri, PA-C [facility] 11/19/20159:50 PM

## 2014-07-23 NOTE — Patient Instructions (Signed)
Toenail Removal Toenails may need to be removed because of injury, infections, or to correct abnormal growth. A special non-stick bandage will likely be put tightly on your toe to prevent bleeding. Often times a new nail will grow back. Sometimes the new nail may be deformed. Most of the time when a nail is lost, it will gradually heal, but may be sensitive for a long time. HOME CARE INSTRUCTIONS   Keep your foot elevated to relieve pain and swelling. This will require lying in bed or on a couch with the leg on pillows or sitting in a recliner with the leg up. Walking or letting your leg dangle may increase swelling, slow healing, and cause throbbing pain.  Keep your bandage dry and clean.  Change your bandage in 24 hours.  After your bandage is changed, soak your foot in warm, soapy water for 10 to 20 minutes. Do this 3 times per day. This helps reduce pain and swelling. After soaking your foot apply a clean, dry bandage. Change your bandage if it is wet or dirty.  Only take over-the-counter or prescription medicines for pain, discomfort, or fever as directed by your caregiver.  See your caregiver as needed for problems. You might need a tetanus shot now if:  You have no idea when you had the last one.  You have never had a tetanus shot before.  The injured area had dirt in it. If you need a tetanus shot, and you decide not to get one, there is a rare chance of getting tetanus. Sickness from tetanus can be serious. If you did get a tetanus shot, your arm may swell, get red and warm to the touch at the shot site. This is common and not a problem. SEEK IMMEDIATE MEDICAL CARE IF:   You have increased pain, swelling, redness, warmth, drainage, or bleeding.  You have a fever.  You have swelling that spreads from your toe into your foot. Document Released: 05/20/2003 Document Revised: 11/13/2011 Document Reviewed: 08/31/2008 ExitCare Patient Information 2015 [employer]. This  information is not intended to replace advice given to you by your health care provider. Make sure you discuss any questions you have with your health care provider. 

## 2014-07-24 NOTE — Progress Notes (Signed)
I was directly involved in the patient's care and actively participated during the procedure.  

## 2014-09-07 ENCOUNTER — Ambulatory Visit (INDEPENDENT_AMBULATORY_CARE_PROVIDER_SITE_OTHER): Payer: 59 | Admitting: Family Medicine

## 2014-09-07 VITALS — BP 132/82 | HR 68 | Temp 98.2°F | Resp 17 | Ht 71.0 in | Wt 241.0 lb

## 2014-09-07 DIAGNOSIS — J0100 Acute maxillary sinusitis, unspecified: Secondary | ICD-10-CM

## 2014-09-07 DIAGNOSIS — J029 Acute pharyngitis, unspecified: Secondary | ICD-10-CM

## 2014-09-07 DIAGNOSIS — H9391 Unspecified disorder of right ear: Secondary | ICD-10-CM

## 2014-09-07 LAB — POCT RAPID STREP A (OFFICE): Rapid Strep A Screen: NEGATIVE

## 2014-09-07 MED ORDER — AMOXICILLIN 500 MG PO TABS: 500.0000 mg | ORAL_TABLET | Freq: Two times a day (BID) | ORAL | Status: DC

## 2014-09-07 NOTE — Progress Notes (Signed)
 Chief Complaint:  Chief Complaint  Patient presents with  . Cough  . Nasal Congestion  . Sore Throat    HPI: Michael Santos is a 19 y.o. male who is here for  Nasal congestion, sore throat and cough x 1 week. He had chills, has not had any SOB or wheezing that is acute. HE does have allergies and asthma which he takes medicine regualr for.  Has been taking meds regularly without changes in his sxs. . No ear pain but he does not feel much in his ears due to prior infectiosn and scarring. Facial pain and some HA   Past Medical History  Diagnosis Date  . Allergy   . Asthma    Past Surgical History  Procedure Laterality Date  . Cleft palate repair     History   Social History  . Marital Status: Single    Spouse Name: N/A    Number of Children: N/A  . Years of Education: N/A   Social History Main Topics  . Smoking status: Never Smoker   . Smokeless tobacco: None  . Alcohol Use: No  . Drug Use: No  . Sexual Activity: None   Other Topics Concern  . None   Social History Narrative   Family History  Problem Relation Age of Onset  . Cleft palate Father   . Hyperlipidemia Father   . Hypertension Father   . Asthma Father   . COPD Father   . Cleft palate Paternal Grandfather   . Hypertension Mother   . Depression Mother    No Known Allergies Prior to Admission medications   Medication Sig Start Date End Date Taking? Authorizing Provider  albuterol (PROVENTIL HFA;VENTOLIN HFA) 108 (90 BASE) MCG/ACT inhaler Inhale 2 puffs into the lungs every 6 (six) hours as needed.   Yes Historical Provider, MD  Azelastine-Fluticasone (DYMISTA NA) Place into the nose.   Yes Historical Provider, MD  cetirizine (ZYRTEC) 10 MG tablet Take 10 mg by mouth daily.   Yes Historical Provider, MD  fluticasone-salmeterol (ADVAIR HFA) 115-21 MCG/ACT inhaler Inhale 2 puffs into the lungs 2 (two) times daily.   Yes Historical Provider, MD  ibuprofen (ADVIL,MOTRIN) 800 MG tablet Take 800 mg  by mouth every 8 (eight) hours as needed.   Yes Historical Provider, MD     ROS: The patient denies fevers,  night sweats, unintentional weight loss, chest pain, palpitations, wheezing, dyspnea on exertion, nausea, vomiting, abdominal pain, dysuria, hematuria, melena, numbness, weakness, or tingling.   All other systems have been reviewed and were otherwise negative with the exception of those mentioned in the HPI and as above.    PHYSICAL EXAM: Filed Vitals:   09/07/14 1031  BP: 132/82  Pulse: 68  Temp: 98.2 F (36.8 C)  Resp: 17   Filed Vitals:   09/07/14 1031  Height:  (1.803 m)  Weight: 241 lb (109.317 kg)   Body mass index is 33.63 kg/(m^2).  General: Alert, no acute distress HEENT:  Normocephalic, atraumatic, oropharynx patent. EOMI, PERRLA.  +RIght Tm erythematous, and large right tonsil, no exudates. + erytheamtous sinus mucosa/boggy nares.  Cardiovascular:  Regular rate and rhythm, no rubs murmurs or gallops.  No Carotid bruits, radial pulse intact. No pedal edema.  Respiratory: Clear to auscultation bilaterally.  No wheezes, rales, or rhonchi.  No cyanosis, no use of accessory musculature GI: No organomegaly, abdomen is soft and non-tender, positive bowel sounds.  No masses. Skin: No rashes. Neurologic: Facial  musculature symmetric. Psychiatric: Patient is appropriate throughout our interaction. Lymphatic: No cervical lymphadenopathy Musculoskeletal: Gait intact.   LABS: Results for orders placed or performed in visit on 09/07/14  POCT rapid strep A  Result Value Ref Range   Rapid Strep A Screen Negative Negative     EKG/XRAY:   Primary read interpreted by Dr. Conley Rolls at [facility].   ASSESSMENT/PLAN: Encounter Diagnoses  Name Primary?  . Acute pharyngitis, unspecified pharyngitis type   . Acute maxillary sinusitis, recurrence not specified Yes  . Abnormal ear exam, right    Cont with allergy and asthma meds Rx Amoxacillin 500 mg BID x 10 days F/u  prn  Gross sideeffects, risk and benefits, and alternatives of medications d/w patient. Patient is aware that all medications have potential sideeffects and we are unable to predict every sideeffect or drug-drug interaction that may occur.  ,  PHUONG, DO 09/07/2014 11:45 AM

## 2014-09-07 NOTE — Patient Instructions (Signed)

## 2014-09-09 LAB — CULTURE, GROUP A STREP

## 2014-10-14 ENCOUNTER — Ambulatory Visit (INDEPENDENT_AMBULATORY_CARE_PROVIDER_SITE_OTHER): Payer: 59 | Admitting: Internal Medicine

## 2014-10-14 ENCOUNTER — Encounter: Payer: Self-pay | Admitting: Internal Medicine

## 2014-10-14 VITALS — BP 124/81 | HR 89 | Temp 97.9°F | Resp 16 | Ht 71.0 in | Wt 245.0 lb

## 2014-10-14 DIAGNOSIS — R635 Abnormal weight gain: Secondary | ICD-10-CM

## 2014-10-14 DIAGNOSIS — E162 Hypoglycemia, unspecified: Secondary | ICD-10-CM

## 2014-10-14 DIAGNOSIS — Z23 Encounter for immunization: Secondary | ICD-10-CM

## 2014-10-14 DIAGNOSIS — Z0000 Encounter for general adult medical examination without abnormal findings: Secondary | ICD-10-CM

## 2014-10-14 DIAGNOSIS — R42 Dizziness and giddiness: Secondary | ICD-10-CM

## 2014-10-14 DIAGNOSIS — L906 Striae atrophicae: Secondary | ICD-10-CM

## 2014-10-14 DIAGNOSIS — R799 Abnormal finding of blood chemistry, unspecified: Secondary | ICD-10-CM

## 2014-10-14 DIAGNOSIS — R7309 Other abnormal glucose: Secondary | ICD-10-CM

## 2014-10-14 LAB — CBC
HEMATOCRIT: 45.3 % (ref 39.0–52.0)
Hemoglobin: 15.5 g/dL (ref 13.0–17.0)
MCH: 28.4 pg (ref 26.0–34.0)
MCHC: 34.2 g/dL (ref 30.0–36.0)
MCV: 83.1 fL (ref 78.0–100.0)
MPV: 11.2 fL (ref 8.6–12.4)
Platelets: 191 10*3/uL (ref 150–400)
RBC: 5.45 MIL/uL (ref 4.22–5.81)
RDW: 13.2 % (ref 11.5–15.5)
WBC: 6.2 10*3/uL (ref 4.0–10.5)

## 2014-10-14 LAB — GLUCOSE, POCT (MANUAL RESULT ENTRY): POC GLUCOSE: 81 mg/dL (ref 70–99)

## 2014-10-14 NOTE — Patient Instructions (Signed)
We have ordered labs to address the low blood sugar.  In the meantime, please record whether you eat and what you eat during the day when these symptoms occur.  You can keep it on your i-phone--if these symptoms arise again.  Please eat balanced breakfasts, fresh fruits and green vegetables.   Keeping you healthy  Get these tests  Blood pressure- Have your blood pressure checked once a year by your healthcare provider.  Normal blood pressure is 120/80.  Weight- Have your body mass index (BMI) calculated to screen for obesity.  BMI is a measure of body fat based on height and weight. You can also calculate your own BMI at https://www.west-esparza.com/www.nhlbisupport.com/bmi/.  Cholesterol- Have your cholesterol checked regularly starting at age 20, sooner may be necessary if you have diabetes, high blood pressure, if a family member developed heart diseases at an early age or if you smoke.   Chlamydia, HIV, and other sexual transmitted disease- Get screened each year until the age of 20 then within three months of each new sexual partner.  Diabetes- Have your blood sugar checked regularly if you have high blood pressure, high cholesterol, a family history of diabetes or if you are overweight.  Get these vaccines  Flu shot- Every fall.  Tetanus shot- Every 10 years.  Menactra- Single dose; prevents meningitis.  Take these steps  Don't smoke- If you do smoke, ask your healthcare provider about quitting. For tips on how to quit, go to www.smokefree.gov or call 1-800-QUIT-NOW.  Be physically active- Exercise 5 days a week for at least 30 minutes.  If you are not already physically active start slow and gradually work up to 30 minutes of moderate physical activity.  Examples of moderate activity include walking briskly, mowing the yard, dancing, swimming bicycling, etc.  Eat a healthy diet- Eat a variety of healthy foods such as fruits, vegetables, low fat milk, low fat cheese, yogurt, lean meats, poultry, fish, beans,  tofu, etc.  For more information on healthy eating, go to www.thenutritionsource.org  Drink alcohol in moderation- Limit alcohol intake two drinks or less a day.  Never drink and drive.  Dentist- Brush and floss teeth twice daily; visit your dentis twice a year.  Depression-Your emotional health is as important as your physical health.  If you're feeling down, losing interest in things you normally enjoy please talk with your healthcare provider.  Gun Safety- If you keep a gun in your home, keep it unloaded and with the safety lock on.  Bullets should be stored separately.  Helmet use- Always wear a helmet when riding a motorcycle, bicycle, rollerblading or skateboarding.  Safe sex- If you may be exposed to a sexually transmitted infection, use a condom  Seat belts- Seat bels can save your life; always wear one.  Smoke/Carbon Monoxide detectors- These detectors need to be installed on the appropriate level of your home.  Replace batteries at least once a year.  Skin Cancer- When out in the sun, cover up and use sunscreen SPF 15 or higher.  Violence- If anyone is threatening or hurting you, please tell your healthcare provider.

## 2014-10-14 NOTE — Progress Notes (Signed)
[facility] [address] [place] [address] [phone]  Date:  10/14/2014   Name:  Michael Santos   DOB:  [birth year]   MRN:  [id number]  PCP:  Michael Pearson, MD    Chief Complaint: Annual Exam   History of Present Illness:  Michael Santos is a 19 y.o. very pleasant male patient with PMH of asthma and allergies, who is here today for a complete physical exam.  Patient states he has one concern of his blood glucose.  He states that he has events of dizziness, nausea, "grumpiness", and mouth tingling.  He checked his blood sugar at home when this occurs and it has been seen as low as 40.  This occurred once in the last 4-5 months.  He has checked it on different days which were around 60, and 70.  It has never been noted to be over 100 unless he eats which he checks 30 minutes after eating, which was 106.  The glucometer was from his father who has diabetes.  His father is on insulin.  He denies every attempting to use the insulin.  He denies tremulousness, no events of polyuria or polydipsia.   Patient has an erratic diet--eating spaghetti in the morning and not eating until dinner.  He eats fastfood heavy meals at times.  He works little hours putting up gutters.    Sleep: Sleeps 8-9 hrs/night without interruption, feeling refreshed.  Exercise: no exercise other than working  Patient has put up gutters for about 6 months.  He has plans to go to community college--study something with automotives.    Bowel movements: No constipation, diarrhea.  Two bowel movements .  No blood in stool.    Urination: No pain with urination.  No rashes.    Sexual Activity: Not currently sexually active.    Asthma: He was taking the albuterol 4x/day.  He does his dymista every day.  Coughing up yellow sputum over the last week.  Rarely ever using before one week.  Used it once yesterday, as he was engaging in outdoor activity.  No use today.  Breathing is fine today.  No  chest pain, palpitaitons, leg swelling.  Cough has improved from the illness.  He had fever during the first 3 days of illness, but this has since resolved.    Patient Active Problem List   Diagnosis Date Noted  . Allergic rhinitis 06/11/2013  . Reactive airway disease 06/11/2013    Past Medical History  Diagnosis Date  . Allergy   . Asthma     Past Surgical History  Procedure Laterality Date  . Cleft palate repair      History  Substance Use Topics  . Smoking status: Never Smoker   . Smokeless tobacco: Not on file  . Alcohol Use: No    Family History  Problem Relation Age of Onset  . Cleft palate Father   . Hyperlipidemia Father   . Hypertension Father   . Asthma Father   . COPD Father   . Cleft palate Paternal Grandfather   . Hypertension Mother   . Depression Mother        Autoimmune Hepatitis    Brother  No Known Allergies  Medication list has been reviewed and updated.  Current Outpatient Prescriptions on File Prior to Visit  Medication Sig Dispense Refill  . albuterol (PROVENTIL HFA;VENTOLIN HFA) 108 (90 BASE) MCG/ACT inhaler Inhale 2 puffs into the lungs every 6 (six) hours  as needed.    . Azelastine-Fluticasone (DYMISTA NA) Place into the nose.    . cetirizine (ZYRTEC) 10 MG tablet Take 10 mg by mouth daily.    . fluticasone-salmeterol (ADVAIR HFA) 115-21 MCG/ACT inhaler Inhale 2 puffs into the lungs 2 (two) times daily.    Marland Kitchen. ibuprofen (ADVIL,MOTRIN) 800 MG tablet Take 800 mg by mouth every 8 (eight) hours as needed.     No current facility-administered medications on file prior to visit.    Review of Systems: ROS otherwise unremarkable  Physical Examination: Filed Vitals:   10/14/14 1427  BP: 124/81  Pulse: 89  Temp: 97.9 F (36.6 C)  Resp: 16   Filed Vitals:   10/14/14 1427  Height: 5\' 11"  (1.803 m)  Weight: 245 lb (111.131 kg)   Body mass index is 34.19 kg/(m^2). Ideal Body Weight: Weight in (lb) to have BMI = 25: 178.9  Wt Readings  from Last 3 Encounters:  10/14/14 245 lb (111.131 kg) (99 %*, Z = 2.34)  09/07/14 241 lb (109.317 kg) (99 %*, Z = 2.28)  07/23/14 238 lb (107.956 kg) (99 %*, Z = 2.24)   * Growth percentiles are based on CDC 2-20 Years data.   Physical Exam  Constitutional: He is oriented to person, place, and time. He appears well-developed and well-nourished. No distress.  HENT:  Head: Normocephalic and atraumatic.  Right Ear: Tympanic membrane, external ear and ear canal normal.  Left Ear: Tympanic membrane, external ear and ear canal normal.  Nose: Septal deviation present. No mucosal edema or rhinorrhea. Right sinus exhibits no maxillary sinus tenderness and no frontal sinus tenderness. Left sinus exhibits no maxillary sinus tenderness and no frontal sinus tenderness.  Mouth/Throat: Posterior oropharyngeal edema (1+) present. No oropharyngeal exudate or posterior oropharyngeal erythema.  Eyes: Conjunctivae and EOM are normal. Pupils are equal, round, and reactive to light. Right eye exhibits no discharge. Left eye exhibits no discharge.  Neck: Normal range of motion. Neck supple. No thyromegaly present.  Cardiovascular: Normal rate, regular rhythm, normal heart sounds and intact distal pulses.  Exam reveals no friction rub.   No murmur heard. Pulmonary/Chest: Effort normal and breath sounds normal. No respiratory distress. He has no decreased breath sounds. He has no wheezes.  Abdominal: Soft. Bowel sounds are normal. He exhibits no mass. There is tenderness (Mild tenderness with deep palpation over LLQ, without mass.  ).  Vertical striae bilaterally along outter border of periumbilical quadrant.  Musculoskeletal: Normal range of motion.  Lymphadenopathy:    He has no cervical adenopathy.  Neurological: He is alert and oriented to person, place, and time. He has normal reflexes. No cranial nerve deficit. He displays a negative Romberg sign. Coordination normal.  Skin: Skin is warm and dry. No rash  noted. He is not diaphoretic. No erythema.  Psychiatric: He has a normal mood and affect. His behavior is normal.   Results for orders placed or performed in visit on 10/14/14  POCT glucose (manual entry)  Result Value Ref Range   POC Glucose 81 70 - 99 mg/dl     Assessment and Plan: 19 year old male is here today with PMH of asthma and allergies.  He is here today for complete physical exam and a concern of symptomatic hypoglycemia.  Annual physical exam  Need for immunization against influenza - Plan: Flu Vaccine QUAD 36+ mos IM (Fluarix)  Low glucose level - Plan: POCT glucose (manual entry), COMPLETE METABOLIC PANEL WITH GFR  Need for Tdap vaccination -  Plan: Tdap vaccine greater than or equal to 7yo IM  Striae - Plan: Cortisol -this could a poor diet with rapid wt gain..  Will  but rule out adrenal/hepatic/renal etiology.  Dizziness - Plan: CBC, COMPLETE METABOLIC PANEL WITH GFR, TSH  Weight gain - Plan: TSH  Trena Platt, PA-C [facility] 2/10/20169:47 PM  I have participated in the care of this patient with the Advanced Practice Provider and agree with Diagnosis and Plan as documented. Robert P. Merla Riches, M.D.

## 2014-10-15 LAB — CORTISOL: CORTISOL PLASMA: 6.5 ug/dL

## 2014-10-15 LAB — COMPLETE METABOLIC PANEL WITH GFR
ALBUMIN: 4.1 g/dL (ref 3.5–5.2)
ALT: 46 U/L (ref 0–53)
AST: 27 U/L (ref 0–37)
Alkaline Phosphatase: 130 U/L — ABNORMAL HIGH (ref 39–117)
BUN: 14 mg/dL (ref 6–23)
CALCIUM: 9.2 mg/dL (ref 8.4–10.5)
CHLORIDE: 103 meq/L (ref 96–112)
CO2: 26 meq/L (ref 19–32)
Creat: 0.95 mg/dL (ref 0.50–1.35)
GFR, Est African American: >89 mL/min
GLUCOSE: 82 mg/dL (ref 70–99)
POTASSIUM: 4.1 meq/L (ref 3.5–5.3)
Sodium: 138 mEq/L (ref 135–145)
Total Bilirubin: 0.8 mg/dL (ref 0.2–1.1)
Total Protein: 6.9 g/dL (ref 6.0–8.3)

## 2014-10-15 LAB — TSH: TSH: 3.089 u[IU]/mL (ref 0.350–4.500)

## 2014-10-25 ENCOUNTER — Encounter: Payer: Self-pay | Admitting: Internal Medicine

## 2015-03-06 ENCOUNTER — Ambulatory Visit (INDEPENDENT_AMBULATORY_CARE_PROVIDER_SITE_OTHER): Payer: 59 | Admitting: Emergency Medicine

## 2015-03-06 VITALS — BP 118/76 | HR 95 | Temp 98.4°F | Resp 17 | Ht 71.0 in | Wt 235.2 lb

## 2015-03-06 DIAGNOSIS — L600 Ingrowing nail: Secondary | ICD-10-CM

## 2015-03-06 DIAGNOSIS — M79674 Pain in right toe(s): Secondary | ICD-10-CM | POA: Diagnosis not present

## 2015-03-06 DIAGNOSIS — M79675 Pain in left toe(s): Secondary | ICD-10-CM | POA: Diagnosis not present

## 2015-03-06 MED ORDER — NAPROXEN 500 MG PO TABS: 500.0000 mg | ORAL_TABLET | Freq: Two times a day (BID) | ORAL | Status: DC

## 2015-03-06 NOTE — Progress Notes (Signed)
Verbal consent obtained.  Patient anesthetized via digital nerve block with 1:1 mix of 1% Lido without and 0.5% Marcaine at the great toe bilaterally.  Sterile pre and drape.  Right medial 1/3 toenail lifted and cut away.  Inspection reveals no retained toenail.  Left lateral toenail lifted and cut away.  Inspection reveals no retained toenail. Xeroform gauze applied bilaterally and clean dressing placed.  Patient advised to RTC as needed.  Wound care instructions provided. Deliah BostonMichael Chelcea Zahn, MS, PA-C   12:36 PM, 03/06/2015

## 2015-03-06 NOTE — Patient Instructions (Signed)

## 2015-03-06 NOTE — Progress Notes (Signed)
° °  Subjective:  This chart was scribed for [name], MD by [facility] Abu Hashem, medical scribe at [facility].The patient was seen in exam room 07 and the patient's care was started at 11:38 AM.   Patient ID: Michael Santos, male    DOB: 03-Jun-1996, 19 y.o.   MRN: [id number] Chief Complaint  Patient presents with   Ingrown Toenail    3 on right foot and 1 on left foot    HPI HPI Comments: Michael Santos is a 19 y.o. male who [facility] complaining of multiple ingrown toe nails, pt is unsure of the onset but he has had these in the past. He has one on each great toe. He was seen here for the same issue, the ingrown toenails were removed at that time. He has tried to remove the ingrown toe nails himself but was unsuccessful. He has never been seen by a podiatrist.   Review of Systems  Respiratory: Positive for shortness of breath.   Skin:       Positive for ingrown toe nail.      Objective:  BP 118/76 mmHg   Pulse 95   Temp(Src) 98.4 F (36.9 C) (Oral)   Resp 17   Ht 5\' 11"  (1.803 m)   Wt 235 lb 3.2 oz (106.686 kg)   BMI 32.82 kg/m2   SpO2 99% Physical Exam  Vitals reviewed. CONSTITUTIONAL: Well developed/well nourished HEAD: Normocephalic/atraumatic EYES: EOMI/PERRL ENMT: Mucous membranes moist NECK: supple no meningeal signs SPINE/BACK:entire spine nontender CV: S1/S2 noted, no murmurs/rubs/gallops noted LUNGS: Lungs are clear to auscultation bilaterally, no apparent distress ABDOMEN: soft, nontender, no rebound or guarding, bowel sounds noted throughout abdomen GU:no cva tenderness NEURO: Pt is awake/alert/appropriate, moves all extremitiesx4.  No facial droop.   EXTREMITIES: pulses normal/equal, full ROM SKIN: warm, color normal. Ingrown toe nail left foot of the greater toe that involves the soft tissue laterally. In grown toe nail on the right foot of the greater toe that involves the soft tissue medially. PSYCH: no  abnormalities of mood noted, alert and oriented to situation     Assessment & Plan:  I think hygiene is a major issue. Portions of both great toenails were removed. Hopefully this will give time for the soft tissue to heal around the nail plates.I personally performed the services described in this documentation, which was scribed in my presence. The recorded information has been reviewed and is accurate.  [name], MD

## 2015-04-02 ENCOUNTER — Ambulatory Visit (INDEPENDENT_AMBULATORY_CARE_PROVIDER_SITE_OTHER): Payer: 59

## 2015-04-02 ENCOUNTER — Ambulatory Visit (INDEPENDENT_AMBULATORY_CARE_PROVIDER_SITE_OTHER): Payer: 59 | Admitting: Family Medicine

## 2015-04-02 VITALS — BP 130/72 | HR 84 | Temp 98.6°F | Resp 18 | Ht 70.75 in | Wt 234.0 lb

## 2015-04-02 DIAGNOSIS — L600 Ingrowing nail: Secondary | ICD-10-CM | POA: Diagnosis not present

## 2015-04-02 DIAGNOSIS — R2231 Localized swelling, mass and lump, right upper limb: Secondary | ICD-10-CM

## 2015-04-02 DIAGNOSIS — R059 Cough, unspecified: Secondary | ICD-10-CM

## 2015-04-02 DIAGNOSIS — R05 Cough: Secondary | ICD-10-CM

## 2015-04-02 MED ORDER — DOXYCYCLINE HYCLATE 100 MG PO CAPS: 100.0000 mg | ORAL_CAPSULE | Freq: Two times a day (BID) | ORAL | Status: DC

## 2015-04-02 NOTE — Patient Instructions (Addendum)
INGROWN TOENAIL . Keep area clean, dry and bandaged for 24 hours. . After 24 hours, remove outer bandage and leave yellow gauze in place. Nuala Alpha toe/foot in warm soapy water for 5-10 minutes, once daily for 5 days. Rebandage toe after each cleaning. . Continue soaks until yellow gauze falls off. . Notify the office if you experience any of the following signs of infection: Swelling, redness, pus drainage, streaking, fever > 101.0 F  I will set you up for an ultrasound of the area under your arm We are going to use doxycycline (antibitoic) for you toe- this will also help with any infection in your lungs Let me know if your cough is not better soon   Try using some athlete's foot spray for your feet

## 2015-04-02 NOTE — Progress Notes (Signed)
[facility] [address] [place] [address] [phone]  Date:  04/02/2015   Name:  Michael Santos   DOB:  03-22-1996   MRN:  [id number]  PCP:  Tonye Pearson, MD    Chief Complaint: Mass; Shortness of Breath; and Ingrown Toenail   History of Present Illness:  Michael Santos is a 19 y.o. very pleasant male patient who presents with the following:  He is here today with a couple of concerns He has noted a lump under his right arm for about one month- it was just a bit tender, but hurts more over the last week or so. It may be getting a little bit bigger.  It has not drained.  He is getting concerned about this and would like to find out what it is.    He also noted a problem with his right great toenail- it has bothered him for some time. He had a medial wedge resection in the past, but now the lateral edge also seems to be red, inflamed, and infected,  He would like to have the entire nail removed if appropriate  He noted pain in his right chest when he takes a deep breath for about one month as well.  The pain is non- exertional.  He has had a little bit of a cough- non productive He has not noted a fever He does have a history of RAD and AR, uses albuterol as neededm adviar   Patient Active Problem List   Diagnosis Date Noted  . Allergic rhinitis 06/11/2013  . Reactive airway disease 06/11/2013    Past Medical History  Diagnosis Date  . Allergy   . Asthma     Past Surgical History  Procedure Laterality Date  . Cleft palate repair      History  Substance Use Topics  . Smoking status: Never Smoker   . Smokeless tobacco: Not on file  . Alcohol Use: No    Family History  Problem Relation Age of Onset  . Cleft palate Father   . Hyperlipidemia Father   . Hypertension Father   . Asthma Father   . COPD Father   . Cleft palate Paternal Grandfather   . Hypertension Mother   . Depression Mother     No Known  Allergies  Medication list has been reviewed and updated.  Current Outpatient Prescriptions on File Prior to Visit  Medication Sig Dispense Refill  . albuterol (PROVENTIL HFA;VENTOLIN HFA) 108 (90 BASE) MCG/ACT inhaler Inhale 2 puffs into the lungs every 6 (six) hours as needed.    . Azelastine-Fluticasone (DYMISTA NA) Place into the nose.    . cetirizine (ZYRTEC) 10 MG tablet Take 10 mg by mouth daily.    . fluticasone-salmeterol (ADVAIR HFA) 115-21 MCG/ACT inhaler Inhale 2 puffs into the lungs 2 (two) times daily.    Marland Kitchen ibuprofen (ADVIL,MOTRIN) 800 MG tablet Take 800 mg by mouth every 8 (eight) hours as needed.    . naproxen (NAPROSYN) 500 MG tablet Take 1 tablet (500 mg total) by mouth 2 (two) times daily with a meal. (Patient not taking: Reported on 04/02/2015) 30 tablet 0   No current facility-administered medications on file prior to visit.    Review of Systems:  As per HPI- otherwise negative.   Physical Examination: Filed Vitals:   04/02/15 1528  BP: 130/72  Pulse: 84  Temp: 98.6 F (37 C)  Resp: 18   Filed Vitals:   04/02/15 1528  Height: 5' 10.75" (1.797 m)  Weight: 234 lb (106.142 kg)   Body mass index is 32.87 kg/(m^2). Ideal Body Weight: Weight in (lb) to have BMI = 25: 177.6  GEN: WDWN, NAD, Non-toxic, A & O x 3, overweight, looks well.  History of cleft repair HEENT: Atraumatic, Normocephalic. Neck supple. No masses, No LAD.  Bilateral TM wnl, oropharynx normal.  PEERL,EOMI.   Ears and Nose: No external deformity. CV: RRR, No M/G/R. No JVD. No thrill. No extra heart sounds. PULM: CTA B, no wheezes, crackles, rhonchi. No retractions. No resp. distress. No accessory muscle use. EXTR: No c/c/e NEURO Normal gait.  PSYCH: Normally interactive. Conversant. Not depressed or anxious appearing.  Calm demeanor.  Right great toe shows lateral edge to be ingrown with scant purulent discharge, tenderness, and inflammation.  Noted tinea pedis There is a half baseball  sized mass under the right axilla, seems to be subcutaneous or deeper.  It is mobile and firm but not hard, suspect lipoma   [facility] reading (PRIMARY) by  Dr. Patsy Lager. CXR: negative CHEST 2 VIEW  COMPARISON: None.  FINDINGS: Normal mediastinum and cardiac silhouette. Normal pulmonary vasculature. No evidence of effusion, infiltrate, or pneumothorax. No acute bony abnormality.  IMPRESSION: No acute cardiopulmonary process. Normal chest radiograph.  Assessment and Plan: Cough - Plan: DG Chest 2 View, doxycycline (VIBRAMYCIN) 100 MG capsule  Mass of right axilla - Plan: [place] Misc Soft Tissue  Ingrown right big toenail - Plan: doxycycline (VIBRAMYCIN) 100 MG capsule  Ingrown nail treated as above Will arrange [place] for mass under his arm He has noted a cough for some time.  No wheezing or distress.  Plan to use doxycycline for his toenail anyway, will also treat any bronchitis that may be present.  He will let us know if not better soon  Signed Abbe Amsterdam, MD

## 2015-04-02 NOTE — Progress Notes (Signed)
Verbal consent obtained. Digital block with 4 cc 2% lidocaine plain.  SP&D.  Entire nail lifted and removed.  Xeroform placed.  Cleansed and dressed.  

## 2015-04-09 ENCOUNTER — Other Ambulatory Visit: Payer: Self-pay | Admitting: Family Medicine

## 2015-04-09 ENCOUNTER — Ambulatory Visit
Admission: RE | Admit: 2015-04-09 | Discharge: 2015-04-09 | Disposition: A | Discharge status: Home / Self Care | Payer: 59 | Source: Ambulatory Visit | Attending: Family Medicine | Admitting: Family Medicine

## 2015-04-09 DIAGNOSIS — R2231 Localized swelling, mass and lump, right upper limb: Secondary | ICD-10-CM

## 2015-04-12 ENCOUNTER — Other Ambulatory Visit: Payer: Self-pay | Admitting: Family Medicine

## 2015-04-12 ENCOUNTER — Telehealth: Payer: Self-pay | Admitting: Family Medicine

## 2015-04-12 DIAGNOSIS — R2231 Localized swelling, mass and lump, right upper limb: Secondary | ICD-10-CM

## 2015-04-12 NOTE — Telephone Encounter (Signed)
Patient states that he had a missed call. No VM was left but he thinks it may be about a biopsy. Please call him at [phone]

## 2015-04-12 NOTE — Progress Notes (Unsigned)
Called [facility] and arranged his needle bx.  Arrive at 1:40 this Thursday 8/11 pm for a [facility]pm appt [facility].  No heavy meal prior to coming in for test   [facility] ?  Address: [address], [place], [place] [address]  [phone]  Called and [facility] for him, will call back to make sure he gets information

## 2015-04-15 ENCOUNTER — Inpatient Hospital Stay: Admission: RE | Admit: 2015-04-15 | Payer: 59 | Source: Ambulatory Visit

## 2015-04-15 ENCOUNTER — Telehealth: Payer: Self-pay

## 2015-04-15 NOTE — Telephone Encounter (Signed)
Patient has a lump under his right arm pit and states he was to have a biopsy. He stated he received a call from our office on Monday 04/12/15. I cant tell who called him from our office. He stated he called back on Monday and has not had a return call. Please return patients call to [phone].

## 2015-04-16 NOTE — Telephone Encounter (Signed)
Called him back- unfortunately it looks like the VM I left him on Monday did not work and he missed his bx appt yesterday.  Gave him the number for the [place] [facility] and he will call today and reschedule this appt.  I will also call them and give them a heads up

## 2015-04-16 NOTE — Telephone Encounter (Signed)
Dr. Patsy Lager did you call pt?

## 2015-04-22 ENCOUNTER — Ambulatory Visit
Admission: RE | Admit: 2015-04-22 | Discharge: 2015-04-22 | Disposition: A | Discharge status: Home / Self Care | Payer: 59 | Source: Ambulatory Visit | Attending: Family Medicine | Admitting: Family Medicine

## 2015-04-22 ENCOUNTER — Other Ambulatory Visit: Payer: Self-pay | Admitting: Family Medicine

## 2015-04-22 ENCOUNTER — Other Ambulatory Visit: Payer: 59

## 2015-04-22 ENCOUNTER — Other Ambulatory Visit (HOSPITAL_COMMUNITY)
Admission: RE | Admit: 2015-04-22 | Discharge: 2015-04-22 | Disposition: A | Discharge status: Home / Self Care | Payer: 59 | Source: Ambulatory Visit | Attending: Diagnostic Radiology | Admitting: Diagnostic Radiology

## 2015-04-22 DIAGNOSIS — R2231 Localized swelling, mass and lump, right upper limb: Secondary | ICD-10-CM

## 2015-04-22 DIAGNOSIS — L02411 Cutaneous abscess of right axilla: Secondary | ICD-10-CM | POA: Diagnosis present

## 2015-04-25 LAB — CULTURE, ROUTINE-ABSCESS: Culture: NO GROWTH

## 2015-05-10 ENCOUNTER — Encounter: Payer: Self-pay | Admitting: Family Medicine

## 2015-06-29 ENCOUNTER — Ambulatory Visit (INDEPENDENT_AMBULATORY_CARE_PROVIDER_SITE_OTHER): Payer: 59 | Admitting: Physician Assistant

## 2015-06-29 VITALS — BP 118/86 | HR 85 | Temp 97.7°F | Resp 16 | Ht 70.0 in | Wt 241.2 lb

## 2015-06-29 DIAGNOSIS — H66001 Acute suppurative otitis media without spontaneous rupture of ear drum, right ear: Secondary | ICD-10-CM

## 2015-06-29 MED ORDER — AMOXICILLIN 875 MG PO TABS: 875.0000 mg | ORAL_TABLET | Freq: Two times a day (BID) | ORAL | Status: AC

## 2015-06-29 NOTE — Progress Notes (Signed)
   Michael Santos  MRN: [id number] DOB: [birth year]  Subjective:  Pt presents to clinic with right ear pain for the last 2 days which seems to be getting worse.  Then he woke up this am with other cold symptoms. Nasal congestion with yellow rhinorrhea and a dry cough.  He has asthma and takes his medication daily but he has had no problems with his asthma during this episode.  Home treatment - none Sick contact at home  Patient Active Problem List   Diagnosis Date Noted  . Allergic rhinitis 06/11/2013  . Reactive airway disease 06/11/2013    Current Outpatient Prescriptions on File Prior to Visit  Medication Sig Dispense Refill  . albuterol (PROVENTIL HFA;VENTOLIN HFA) 108 (90 BASE) MCG/ACT inhaler Inhale 2 puffs into the lungs every 6 (six) hours as needed.    . Azelastine-Fluticasone (DYMISTA NA) Place into the nose.    . cetirizine (ZYRTEC) 10 MG tablet Take 10 mg by mouth daily.    . fluticasone-salmeterol (ADVAIR HFA) 115-21 MCG/ACT inhaler Inhale 2 puffs into the lungs 2 (two) times daily.     No current facility-administered medications on file prior to visit.    No Known Allergies  Review of Systems  Constitutional: Negative for fever and chills.  HENT: Positive for congestion, ear pain (right) and rhinorrhea (yellow). Negative for ear discharge, hearing loss, postnasal drip and sore throat.   Respiratory: Positive for cough (dry).   Gastrointestinal: Negative for nausea, vomiting and diarrhea.  Allergic/Immunologic: Negative for environmental allergies.   Objective:  BP 118/86 mmHg  Pulse 85  Temp(Src) 97.7 F (36.5 C) (Oral)  Resp 16  Ht 5\' 10"  (1.778 m)  Wt 241 lb 3.2 oz (109.408 kg)  BMI 34.61 kg/m2  SpO2 99%  Physical Exam  Constitutional: He is oriented to person, place, and time and well-developed, well-nourished, and in no distress.     HENT:  Head: Normocephalic and atraumatic.  Right Ear: Hearing, external ear and ear canal normal. Tympanic  membrane is erythematous (mild) and bulging. Tympanic membrane is not perforated. A middle ear effusion (purulent fluid behind TM) is present.  Left Ear: Hearing, tympanic membrane, external ear and ear canal normal.  Nose: Nose normal.  Mouth/Throat: Uvula is midline, oropharynx is clear and moist and mucous membranes are normal.  Eyes: Conjunctivae are normal.  Neck: Normal range of motion.  Cardiovascular: Normal rate, regular rhythm and normal heart sounds.   Pulmonary/Chest: Effort normal and breath sounds normal. He has no wheezes.  Lymphadenopathy:       Head (right side): No tonsillar adenopathy present.       Head (left side): No tonsillar adenopathy present.    He has no cervical adenopathy.       Right: No supraclavicular adenopathy present.       Left: No supraclavicular adenopathy present.  Neurological: He is alert and oriented to person, place, and time. Gait normal.  Skin: Skin is warm and dry.  Psychiatric: Mood, memory, affect and judgment normal.    Assessment and Plan :  Acute suppurative otitis media of right ear without spontaneous rupture of tympanic membrane, recurrence not specified - Plan: amoxicillin (AMOXIL) 875 MG tablet   Symptomatic care for the cold symptoms was discussed with the patient.  Benny LennertSarah Weber PA-C  [facility] 06/29/2015 9:27 AM

## 2015-08-02 ENCOUNTER — Encounter: Payer: Self-pay | Admitting: Internal Medicine

## 2015-09-15 MED FILL — ADVAIR HFA 45-21 MCG INH: 45-21 | 30 days supply | Qty: 12 | Fill #5

## 2015-09-15 MED FILL — PROAIR HFA 90 MCG INHALER: 108 (90 BAS | 25 days supply | Qty: 9 | Fill #2

## 2015-10-23 ENCOUNTER — Emergency Department
Admission: EM | Admit: 2015-10-23 | Discharge: 2015-10-23 | Disposition: A | Discharge status: Home / Self Care | Payer: 59 | Source: Home / Self Care | Attending: Family Medicine | Admitting: Family Medicine

## 2015-10-23 ENCOUNTER — Encounter: Payer: Self-pay | Admitting: Emergency Medicine

## 2015-10-23 DIAGNOSIS — H6981 Other specified disorders of Eustachian tube, right ear: Secondary | ICD-10-CM

## 2015-10-23 DIAGNOSIS — J069 Acute upper respiratory infection, unspecified: Secondary | ICD-10-CM

## 2015-10-23 DIAGNOSIS — B9789 Other viral agents as the cause of diseases classified elsewhere: Principal | ICD-10-CM

## 2015-10-23 MED ORDER — PREDNISONE 20 MG PO TABS: 20.0000 mg | ORAL_TABLET | Freq: Two times a day (BID) | ORAL | Status: DC

## 2015-10-23 MED ORDER — BENZONATATE 200 MG PO CAPS: 200.0000 mg | ORAL_CAPSULE | Freq: Every day | ORAL | Status: DC

## 2015-10-23 MED ORDER — AMOXICILLIN 875 MG PO TABS: 875.0000 mg | ORAL_TABLET | Freq: Two times a day (BID) | ORAL | Status: DC

## 2015-10-23 NOTE — Discharge Instructions (Signed)
Take plain guaifenesin (  extended release tabs such as Mucinex) twice daily, with plenty of water, for cough and congestion.  May add Pseudoephedrine ( , one or two every 4 to 6 hours) for sinus congestion.  Get adequate rest.   May use Afrin nasal spray (or generic oxymetazoline) twice daily for about 5 days and then discontinue.  Also recommend using saline nasal spray several times daily and saline nasal irrigation (AYR is a common brand).  Use Flonase nasal spray each morning after using Afrin nasal spray and saline nasal irrigation. Try warm salt water gargles for sore throat.  Stop all antihistamines for now, and other non-prescription cough/cold preparations.  Continue Advair.  Use albuterol inhaler as needed.

## 2015-10-23 NOTE — ED Notes (Signed)
Pt c/o sore throat x 3 days, chest congestion, cough, runny nose.

## 2015-10-23 NOTE — ED Provider Notes (Signed)
CSN: [id number]     Arrival date & time 10/23/15  1001 History   First MD Initiated Contact with Patient 10/23/15 1120     Chief Complaint  Patient presents with  . URI     HPI Comments: Patient complains of three day history of typical cold-like symptoms including mild sore throat, sinus congestion, fatigue, and cough.   The history is provided by the patient.    Past Medical History  Diagnosis Date  . Allergy   . Asthma    Past Surgical History  Procedure Laterality Date  . Cleft palate repair    . Tympanostomy tube placement     Family History  Problem Relation Age of Onset  . Cleft palate Father   . Hyperlipidemia Father   . Hypertension Father   . Asthma Father   . COPD Father   . Cleft palate Paternal Grandfather   . Hypertension Mother   . Depression Mother    Social History  Substance Use Topics  . Smoking status: Never Smoker   . Smokeless tobacco: None  . Alcohol Use: No    Review of Systems + sore throat + cough No pleuritic pain + wheezing + nasal congestion + post-nasal drainage No sinus pain/pressure No itchy/red eyes No earache No hemoptysis No SOB No fever/chills No nausea No vomiting No abdominal pain No diarrhea No urinary symptoms No skin rash + fatigue No myalgias No headache Used OTC meds without relief  Allergies  Dust mite extract and Pollen extract  Home Medications   Prior to Admission medications   Medication Sig Start Date End Date Taking? Authorizing Provider  amoxicillin (AMOXIL) 875 MG tablet Take 1 tablet (875 mg total) by mouth 2 (two) times daily. 10/23/15   Lattie Haw, MD  benzonatate (TESSALON) 200 MG capsule Take 1 capsule (200 mg total) by mouth at bedtime. Take as needed for cough 10/23/15   Lattie Haw, MD  cetirizine (ZYRTEC) 10 MG tablet Take 10 mg by mouth daily.    Historical Provider, MD  fluticasone-salmeterol (ADVAIR HFA) 115-21 MCG/ACT inhaler Inhale 2 puffs into the lungs 2 (two) times  daily.    Historical Provider, MD  predniSONE (DELTASONE) 20 MG tablet Take 1 tablet (20 mg total) by mouth 2 (two) times daily. Take with food. 10/23/15   Lattie Haw, MD   Meds Ordered and Administered this Visit  Medications - No data to display  BP 131/82 mmHg  Pulse 91  Temp(Src) 98.2 F (36.8 C) (Oral)  Ht 6' (1.829 m)  Wt 245 lb 12 oz (111.471 kg)  BMI 33.32 kg/m2  SpO2 98% No data found.   Physical Exam Nursing notes and Vital Signs reviewed. Appearance:  Patient appears stated age, and in no acute distress Eyes:  Pupils are equal, round, and reactive to light and accomodation.  Extraocular movement is intact.  Conjunctivae are not inflamed  Ears:  Canals normal.  Right tympanic membrane slightly erythematous with decreased landmarks.  Nose:  Congested turbinates.  No sinus tenderness.    Pharynx:  Uvula mildly edematous Neck:  Supple.  Tender enlarged posterior nodes are palpated bilaterally  Lungs:  Clear to auscultation.  Breath sounds are equal.  Moving air well. Heart:  Regular rate and rhythm without murmurs, rubs, or gallops.  Abdomen:  Nontender without masses or hepatosplenomegaly.  Bowel sounds are present.  No CVA or flank tenderness.  Extremities:  No edema.  Skin:  No rash present.    ED  Course  Procedures none    Labs Reviewed -  POCT rapid strep test negative   Tympanogram:  Left ear high peak height;  Right ear wide, and negative peak pressure    MDM   1. Viral URI with cough   2. Eustachian tube dysfunction, right; suspect developing otitis media    Begin prednisone burst, and amoxicillin  BID. Prescription written for Benzonatate [facility]) to take at bedtime for night-time cough. Take plain guaifenesin (  extended release tabs such as Mucinex) twice daily, with plenty of water, for cough and congestion.  May add Pseudoephedrine ( , one or two every 4 to 6 hours) for sinus congestion.  Get adequate rest.   May use Afrin nasal  spray (or generic oxymetazoline) twice daily for about 5 days and then discontinue.  Also recommend using saline nasal spray several times daily and saline nasal irrigation (AYR is a common brand).  Use Flonase nasal spray each morning after using Afrin nasal spray and saline nasal irrigation. Try warm salt water gargles for sore throat.  Stop all antihistamines for now, and other non-prescription cough/cold preparations.  Continue Advair.  Use albuterol inhaler as needed. Followup with ENT if not improving 7 to 10 days.   Lattie Haw, MD 10/26/15 1110

## 2015-11-06 ENCOUNTER — Ambulatory Visit (INDEPENDENT_AMBULATORY_CARE_PROVIDER_SITE_OTHER): Payer: 59 | Admitting: Physician Assistant

## 2015-11-06 VITALS — BP 124/82 | HR 78 | Temp 97.9°F | Resp 18 | Wt 240.0 lb

## 2015-11-06 DIAGNOSIS — L600 Ingrowing nail: Secondary | ICD-10-CM

## 2015-11-06 DIAGNOSIS — J019 Acute sinusitis, unspecified: Secondary | ICD-10-CM | POA: Diagnosis not present

## 2015-11-06 MED ORDER — FLUTICASONE PROPIONATE 50 MCG/ACT NA SUSP: 2.0000 | Freq: Every day | NASAL | Status: DC

## 2015-11-06 MED ORDER — AMOXICILLIN-POT CLAVULANATE 875-125 MG PO TABS: 1.0000 | ORAL_TABLET | Freq: Two times a day (BID) | ORAL | Status: AC

## 2015-11-06 MED ORDER — GUAIFENESIN ER 1200 MG PO TB12: 1.0000 | ORAL_TABLET | Freq: Two times a day (BID) | ORAL | Status: DC | PRN

## 2015-11-06 NOTE — Progress Notes (Signed)
[facility] [address], [place] [address] [phone]  Date:  11/06/2015   Name:  Michael Santos   DOB:  May 30, 1996   MRN:  [id number]  PCP:  Tonye Pearson, MD    History of Present Illness:  Michael Santos is a 20 y.o. male patient who presents to [facility] for cc of toenail pain and upper respiratory symptoms. URsxs: Began with cold-like symptoms of congestion, cough, and sore throat for 1 week, and then in the last 4 days, he now has sinus pressure.  Mucus is a thick yellow brown mucus from nostril.  He has intermittent non-productive cough.  No fever.  He denies sob or dyspnea.  Nail pain: right 1st toe is painful at the nail bed.  He states that redness started to develop at the lateral side of his toe, where his nail was growing in.  He attempted to cut the distal part of the nail which helps the nail pain but this is still present.  He has had several nail removals in the past of his 1st toes.     Patient Active Problem List   Diagnosis Date Noted  . Allergic rhinitis 06/11/2013  . Reactive airway disease 06/11/2013    Past Medical History  Diagnosis Date  . Allergy   . Asthma     Past Surgical History  Procedure Laterality Date  . Cleft palate repair    . Tympanostomy tube placement      Social History  Substance Use Topics  . Smoking status: Never Smoker   . Smokeless tobacco: None  . Alcohol Use: No    Family History  Problem Relation Age of Onset  . Cleft palate Father   . Hyperlipidemia Father   . Hypertension Father   . Asthma Father   . COPD Father   . Cleft palate Paternal Grandfather   . Hypertension Mother   . Depression Mother     Allergies  Allergen Reactions  . Dust Mite Extract   . Pollen Extract     Medication list has been reviewed and updated.  Current Outpatient Prescriptions on File Prior to Visit  Medication Sig Dispense Refill  . cetirizine (ZYRTEC) 10 MG tablet Take 10 mg by mouth daily.    .  fluticasone-salmeterol (ADVAIR HFA) 115-21 MCG/ACT inhaler Inhale 2 puffs into the lungs 2 (two) times daily.    Marland Kitchen amoxicillin (AMOXIL) 875 MG tablet Take 1 tablet (875 mg total) by mouth 2 (two) times daily. (Patient not taking: Reported on 11/06/2015) 20 tablet 0  . benzonatate (TESSALON) 200 MG capsule Take 1 capsule (200 mg total) by mouth at bedtime. Take as needed for cough (Patient not taking: Reported on 11/06/2015) 12 capsule 0  . predniSONE (DELTASONE) 20 MG tablet Take 1 tablet (20 mg total) by mouth 2 (two) times daily. Take with food. (Patient not taking: Reported on 11/06/2015) 10 tablet 0   No current facility-administered medications on file prior to visit.    ROS ROS otherwise unremarkable unless listed above.   Physical Examination: BP 124/82 mmHg  Pulse 78  Temp(Src) 97.9 F (36.6 C) (Oral)  Resp 18  Wt 240 lb (108.863 kg)  SpO2 98% Ideal Body Weight:    Physical Exam  Constitutional: He is oriented to person, place, and time. He appears well-developed and well-nourished. No distress.  HENT:  Head: Atraumatic.  Right Ear: Tympanic membrane, external ear and ear canal normal.  Left Ear: Tympanic membrane,  external ear and ear canal normal.  Nose: Mucosal edema and rhinorrhea present. Right sinus exhibits maxillary sinus tenderness. Right sinus exhibits no frontal sinus tenderness. Left sinus exhibits maxillary sinus tenderness. Left sinus exhibits no frontal sinus tenderness.  Mouth/Throat: No uvula swelling. No oropharyngeal exudate, posterior oropharyngeal edema or posterior oropharyngeal erythema.  Eyes: Conjunctivae, EOM and lids are normal. Pupils are equal, round, and reactive to light. Right eye exhibits normal extraocular motion. Left eye exhibits normal extraocular motion.  Neck: Trachea normal and full passive range of motion without pain. No edema and no erythema present.  Cardiovascular: Normal rate and regular rhythm.   Pulmonary/Chest: Effort normal. No  respiratory distress. He has no decreased breath sounds. He has no wheezes. He has no rhonchi.  Lymphadenopathy:       Head (right side): No submandibular and no tonsillar adenopathy present.       Head (left side): No submandibular and no tonsillar adenopathy present.    He has no cervical adenopathy.  Neurological: He is alert and oriented to person, place, and time.  Skin: Skin is warm and dry. He is not diaphoretic.  Right toe with distal tenderness at the lateral side of the toe with palpation.  Erythema localized where nail was removed.  No purulent drainage.   Psychiatric: He has a normal mood and affect. His behavior is normal.   Procedure: verbal consent obtained.  Alcohol swabbed at injection site of right 1st digital block location.  .5% marcaine placed digital block.  Anesthesia obtained.  Povidine swabbed.  Lateral nail wedge removed without difficulty with nail cutter, and curved hemostats.  Cleansed with normal saline.  Xeroform placed over the nail matrix of missing lateral wedge.  Dressings applied.   Assessment and Plan: Michael Santos is a 20 y.o. male who is here today for sinus pressure, and toe nail pain of right 1st toe -removed without difficulty.  Advised wound care.  Alarming sxs discussed to warrant immediate return. -will treat with abx and supportive below.   -rtc if sxs persist. Subacute sinusitis, unspecified location - Plan: amoxicillin-clavulanate (AUGMENTIN) 875-125 MG tablet, fluticasone (FLONASE) 50 MCG/ACT nasal spray, Guaifenesin (MUCINEX MAXIMUM STRENGTH) 1200 MG TB12  Ingrown right big toenail  Trena PlattStephanie English, PA-C [facility] 3/4/20179:45 AM

## 2015-11-06 NOTE — Patient Instructions (Signed)
After 24-48 hours, soak the nail in water.  Replace the cloth with more of the xeroform.  Cover with bandaging.  Do this daily. Please take the augmentin for your sinus infection.  Make sure that you are hydrating well with 64 oz of water each day.  You have made great strides with the weight loss and dieting.  Keep up the good work!! Watch for alarming signs, regarding the nail, below.  Fingernail or Toenail Removal Fingernail or toenail removal is a surgical procedure to take off a nail from your finger or your toe. You may need to have a fingernail or toenail removed if it has an abnormal shape (deformity) or if it is severely injured. A fingernail or toenail may also be removed due to a bacterial infection, a severe ingrown toenail, or a fungal infection that has failed treatment with antifungal medicines. LET [facility] PROVIDER KNOW ABOUT:  Any allergies you have.  All medicines you are taking, including vitamins, herbs, eye drops, creams, and over-the-counter medicines.  Previous problems you or members of your family have had with the use of anesthetics.  Any blood disorders you have.  Previous surgeries you have had.  Any medical conditions you may have. RISKS AND COMPLICATIONS Generally, this is a safe procedure. However, problems may occur, including:  Pain.  Bleeding.  Infection.  Regrowth of a deformed nail. BEFORE THE PROCEDURE  Ask your health care provider about changing or stopping your regular medicines. This is especially important if you are taking diabetes medicines or blood thinners.  Follow instructions from your health care provider about eating or drinking restrictions.  Plan to have someone take you home after the procedure. PROCEDURE  An IV tube will be inserted into one of your veins.  You will be given one or more of the following:  A medicine that helps you relax (sedative).  A medicine that numbs the area (local anesthetic).  After  your toe or finger is numb, your health care provider will insert a blunt instrument under your nail to lift it up.  In some cases, your health care provider may also make a cut (incision) in your nail.  After your nail is lifted away from your toe or finger, your health care provider will detach it from your nail bed.  A germ-killing bandage (antiseptic dressing) will be put on your toe or finger. The procedure may vary among health care providers and hospitals. AFTER THE PROCEDURE  Your blood pressure, heart rate, breathing rate, and blood oxygen level will be monitored often until the medicines you were given have worn off.  It is common to have some pain after nail removal. You will be given pain medicine as needed.  You may be given a prescription for pain medicine and antibiotic medicine.  If you had a toenail removed, you will be given a surgical shoe to wear while you recover.  If you had a fingernail removed, you may be given a finger splint to wear while you recover.   This information is not intended to replace advice given to you by your health care provider. Make sure you discuss any questions you have with your health care provider.   Document Released: 05/20/2003 Document Revised: 01/05/2015 Document Reviewed: 08/19/2014 Elsevier Interactive Patient Education [employer]2016 Elsevier Inc.

## 2015-12-08 MED FILL — PROAIR HFA 90 MCG INHALER: 108 (90 BAS | 25 days supply | Qty: 9 | Fill #3

## 2015-12-16 ENCOUNTER — Other Ambulatory Visit: Payer: Self-pay

## 2015-12-16 MED ORDER — FLUTICASONE-SALMETEROL 115-21 MCG/ACT IN AERO: 2.0000 | INHALATION_SPRAY | Freq: Two times a day (BID) | RESPIRATORY_TRACT | Status: DC

## 2015-12-16 MED FILL — ADVAIR HFA 115-21 MCG INH: 115-21 | 30 days supply | Qty: 12 | Fill #0

## 2015-12-16 NOTE — Telephone Encounter (Signed)
Judeth CornfieldStephanie, pharm faxed req for Rx of Advair. I don't see that we have Rxd this for pt before although it was on pt's med list at his OV w/you last month. Discussed w/ Dr Merla Richesoolittle at CPE last year. Do you want to auth RF or does pt need to come in for eval first?

## 2016-01-12 ENCOUNTER — Ambulatory Visit (INDEPENDENT_AMBULATORY_CARE_PROVIDER_SITE_OTHER): Payer: 59

## 2016-01-12 ENCOUNTER — Ambulatory Visit (INDEPENDENT_AMBULATORY_CARE_PROVIDER_SITE_OTHER): Payer: 59 | Admitting: Physician Assistant

## 2016-01-12 VITALS — BP 140/80 | HR 82 | Temp 98.2°F | Resp 18 | Ht 72.0 in | Wt 243.0 lb

## 2016-01-12 DIAGNOSIS — M5442 Lumbago with sciatica, left side: Secondary | ICD-10-CM

## 2016-01-12 DIAGNOSIS — L600 Ingrowing nail: Secondary | ICD-10-CM

## 2016-01-12 DIAGNOSIS — M545 Low back pain: Secondary | ICD-10-CM | POA: Diagnosis not present

## 2016-01-12 NOTE — Patient Instructions (Addendum)
INGROWN TOENAIL . Keep area clean, dry and bandaged for 24 hours. . After 24 hours, remove outer bandage and leave yellow gauze in place. Michael Santos. Soak toe/foot in warm soapy water for 5-10 minutes, once daily for 5 days. Rebandage toe after each cleaning. . Continue soaks until yellow gauze falls off. . Notify the office if you experience any of the following signs of infection: Swelling, redness, pus drainage, streaking, fever > 101.0 F   You will get a phone call about making appt with podiatry and ortho    IF you received an x-ray today, you will receive an invoice from [facility]. Please contact [facility] at [phone] with questions or concerns regarding your invoice.   IF you received labwork today, you will receive an invoice from [facility]. Please contact [facility] at [phone] with questions or concerns regarding your invoice.   Our billing staff will not be able to assist you with questions regarding bills from these companies.  You will be contacted with the lab results as soon as they are available. The fastest way to get your results is to activate your My Chart account. Instructions are located on the last page of this paperwork. If you have not heard from us regarding the results in 2 weeks, please contact this office.

## 2016-01-12 NOTE — Progress Notes (Signed)
[facility] [address], [place] [address] [phone]  Date:  01/12/2016   Name:  Michael Santos   DOB:  [birth year]   MRN:  [id number]  PCP:  Tonye PearsonOLITTLE, ROBERT P, MD    Chief Complaint: Nail Problem and Back Pain   History of Present Illness:  This is a 20 y.o. male with PMH allergic rhinitis and RAD who is presenting with back pain and toe problems.  LBP x 5-6 years. Pain is midline, described as dull ache. Over the past 2 months gets occ sharp pain down left leg with bending/twisting motions. States yesterday he was bent over and when he stood up had the radiating pain and then started walking and left leg gave out - first time that had happened. Otherwise no leg weakness. No trouble with bowel or urinary incontinence. About 5 years ago he had xrays of his lower back - radiologist thought there were arthritic changes apparently. Went to see an orthopedist who states it "was just growing pains". Denies paresthesias. 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   Pt is also complaining of ingrown toenail on right 2nd toe. This is a recurrent issue for him. He was seen here two months ago and had right great toenail removed. He states he can't even count how many times he has had toenails removed. He states he never cuts his toenails -- nails reach a certain length and stop growing. States they all grow in in-grown.  Review of Systems:  Review of Systems See HPI  Patient Active Problem List   Diagnosis Date Noted  . Allergic rhinitis 06/11/2013  . Reactive airway disease 06/11/2013    Prior to Admission medications   Medication Sig Start Date End Date Taking? Authorizing Provider  cetirizine (ZYRTEC) 10 MG tablet Take 10 mg by mouth daily.   Yes Historical Provider, MD  fluticasone (FLONASE) 50 MCG/ACT nasal spray Place 2 sprays into both nostrils daily. 11/06/15  Yes Collie SiadStephanie D English, PA  fluticasone-salmeterol (ADVAIR HFA) 115-21 MCG/ACT inhaler Inhale 2 puffs into the  lungs 2 (two) times daily. 12/16/15  Yes Stephanie D English, PA  Guaifenesin (MUCINEX MAXIMUM STRENGTH) 1200 MG TB12 Take 1 tablet (1,200 mg total) by mouth every 12 (twelve) hours as needed. 11/06/15  Yes Collie SiadStephanie D English, PA    Allergies  Allergen Reactions  . Dust Mite Extract   . Pollen Extract     Past Surgical History  Procedure Laterality Date  . Cleft palate repair    . Tympanostomy tube placement      Social History  Substance Use Topics  . Smoking status: Never Smoker   . Smokeless tobacco: None  . Alcohol Use: No    Family History  Problem Relation Age of Onset  . Cleft palate Father   . Hyperlipidemia Father   . Hypertension Father   . Asthma Father   . COPD Father   . Cleft palate Paternal Grandfather   . Hypertension Mother   . Depression Mother     Medication list has been reviewed and updated.  Physical Examination:  Physical Exam  Constitutional: He is oriented to person, place, and time. He appears well-developed and well-nourished. No distress.  HENT:  Head: Normocephalic and atraumatic.  Right Ear: Hearing normal.  Left Ear: Hearing normal.  Nose: Nose normal.  Eyes: Conjunctivae and lids are normal. Right eye exhibits no discharge. Left eye exhibits no discharge. No scleral icterus.  Pulmonary/Chest: Effort normal. No respiratory distress.  Musculoskeletal:  Normal range of motion.       Lumbar back: He exhibits normal range of motion, no tenderness and no bony tenderness.  Straight leg raise negative  Neurological: He is alert and oriented to person, place, and time. He has normal strength and normal reflexes. No sensory deficit. Gait normal.  Skin: Skin is warm, dry and intact.  Right 2nd digit with right sided ingrown toe nail. Surrounding skin is erythematous. Purulence noted at cuticle.  Psychiatric: He has a normal mood and affect. His speech is normal and behavior is normal. Thought content normal.   BP 140/80 mmHg  Pulse 82   Temp(Src) 98.2 F (36.8 C) (Oral)  Resp 18  Ht 6' (1.829 m)  Wt 243 lb (110.224 kg)  BMI 32.95 kg/m2  SpO2 100%  Procedure: Verbal consent obtained. Skin was cleaned with alcohol. Digital block performed with 5 cc 1:1 prep 0.5% marcaine and 2% lido without epi on right 2nd toe. 2 betadine swabs used to clean toe. Sterile field applied. Right wedge excision performed. Phenol applied to nail bed. Xeroform placed on nailbed. Dressing placed and wound care discussed.  Dg Lumbar Spine Complete  01/12/2016  CLINICAL DATA:  Lower left side and back pain EXAM: LUMBAR SPINE - COMPLETE 4+ VIEW COMPARISON:  None. FINDINGS: There is no evidence of lumbar spine fracture. Alignment is normal. Intervertebral disc spaces are maintained. Bilateral renal calcifications are identified. IMPRESSION: 1. Normal appearance of the lumbar spine. 2. Bilateral nephrolithiasis. Electronically Signed   By: Signa Kell M.D.   On: 01/12/2016 09:10    Assessment and Plan:  1. Midline low back pain with left-sided sciatica Radiograph negative except for finding incidental kidney stones. Referred to ortho -- likely will need MRI to further evaluate. - DG Lumbar Spine Complete; Future - Ambulatory referral to Orthopedic Surgery  2. Ingrown toenail Right 2nd toenail wedge excision performed. Phenol applied to nail bed to prevent regrowth. Referred to podiatry for further eval d/t recurrent ingrown toenails. - Ambulatory referral to Podiatry   Roswell Miners. Dyke Brackett, MHS [facility]  01/12/2016

## 2016-01-25 DIAGNOSIS — M545 Low back pain: Secondary | ICD-10-CM | POA: Diagnosis not present

## 2016-01-26 ENCOUNTER — Encounter: Payer: Self-pay | Admitting: Podiatry

## 2016-01-26 ENCOUNTER — Ambulatory Visit (INDEPENDENT_AMBULATORY_CARE_PROVIDER_SITE_OTHER): Payer: 59 | Admitting: Podiatry

## 2016-01-26 VITALS — BP 132/80 | HR 86 | Resp 12 | Ht 72.0 in | Wt 232.0 lb

## 2016-01-26 DIAGNOSIS — L600 Ingrowing nail: Secondary | ICD-10-CM | POA: Diagnosis not present

## 2016-01-26 NOTE — Patient Instructions (Signed)

## 2016-01-26 NOTE — Progress Notes (Signed)
   Subjective:    Patient ID: Michael Santos, male    DOB: February 09, 1996, 20 y.o.   MRN: [id number]  HPI "I've had multiple ingrown toenails.  I've had several of them removed before but they seem to grown back in.  I try to pull them out when they grow in.  They're ingrown on both feet.  I just recently had the one on the right foot done."    Review of Systems  Respiratory:       Asthma  Musculoskeletal: Positive for myalgias.  Allergic/Immunologic: Positive for environmental allergies.  All other systems reviewed and are negative.      Objective:   Physical Exam        Assessment & Plan:

## 2016-01-27 NOTE — Progress Notes (Signed)
Subjective:     Patient ID: Michael Santos, male   DOB: Oct 03, 1995, 20 y.o.   MRN: [id number]  HPI patient states I've had a lot of problems with my nails and I've had the hallux worked on both feet second right with continued problems second right and not sure if I will develop ingrown's on the remaining nails   Review of Systems  All other systems reviewed and are negative.      Objective:   Physical Exam  Constitutional: He is oriented to person, place, and time.  Cardiovascular: Intact distal pulses.   Musculoskeletal: Normal range of motion.  Neurological: He is oriented to person, place, and time.  Skin: Skin is warm.  Nursing note and vitals reviewed.  neurovascular status intact muscle strength adequate range of motion within normal limits with patient found to have incurvated lateral border right second toe and thickness of the hallux nail right and damage to the hallux nail left but nontender. Patient's found have good digital perfusion and well oriented 3     Assessment:     Ingrown toenail deformity second toe right lateral border and damaged hallux nails bilateral    Plan:     H&P and conditions reviewed with patient. We're to focus just on the second which is been giving him trouble and I infiltrated the right second digit 60 [place]igan second marking mixture remove the lateral border exposed matrix and applied phenol 3 applications 30 seconds followed by alcohol lavage. May require work on the big toenails but we'll depending on how it responds

## 2016-01-30 ENCOUNTER — Telehealth: Payer: 59 | Admitting: Family

## 2016-01-30 DIAGNOSIS — J329 Chronic sinusitis, unspecified: Secondary | ICD-10-CM

## 2016-01-30 DIAGNOSIS — A499 Bacterial infection, unspecified: Secondary | ICD-10-CM | POA: Diagnosis not present

## 2016-01-30 DIAGNOSIS — B9689 Other specified bacterial agents as the cause of diseases classified elsewhere: Secondary | ICD-10-CM

## 2016-01-30 MED ORDER — AMOXICILLIN-POT CLAVULANATE 875-125 MG PO TABS: 1.0000 | ORAL_TABLET | Freq: Two times a day (BID) | ORAL | Status: AC

## 2016-01-30 NOTE — Progress Notes (Signed)

## 2016-02-09 ENCOUNTER — Telehealth: Payer: Self-pay | Admitting: *Deleted

## 2016-02-09 NOTE — Telephone Encounter (Signed)
Called patient at [phone] (Cell #) to check to see how they were doing from their ingrown toenail procedure that was performed on Wednesday, Jan 26, 2016. Pt stated, "Doing okay and has no pain".

## 2016-03-09 ENCOUNTER — Telehealth: Payer: 59 | Admitting: Physician Assistant

## 2016-03-09 DIAGNOSIS — R21 Rash and other nonspecific skin eruption: Secondary | ICD-10-CM | POA: Diagnosis not present

## 2016-03-09 MED ORDER — PREDNISONE 10 MG PO TABS: 10.0000 mg | ORAL_TABLET | Freq: Every day | ORAL | Status: DC

## 2016-03-09 NOTE — Progress Notes (Signed)
E Visit for Rash  We are sorry that you are not feeling well. Here is how we plan to help!  Based on what you shared with me it looks like you have an allergic dermatitis.  Contact dermatitis is a skin rash caused by something that touches the skin and causes irritation or inflammation.  Your skin may be red, swollen, dry, cracked, and itch.  The rash should go away in a few days but can last a few weeks.  If you get a rash, it's important to figure out what caused it so the irritant can be avoided in the future. and I have prescribed Prednisone 10 mg daily for 5 days  HOME CARE:   Take cool showers and avoid direct sunlight.  Apply cool compress or wet dressings.  Take a bath in an oatmeal bath.  Sprinkle content of one Aveeno packet under running faucet with comfortably warm water.  Bathe for 15-20 minutes, 1-2 times daily.  Pat dry with a towel. Do not rub the rash.  Use hydrocortisone cream.  Take an antihistamine like Benadryl for widespread rashes that itch.  The adult dose of Benadryl is 25-50 mg by mouth 4 times daily.  Caution:  This type of medication may cause sleepiness.  Do not drink alcohol, drive, or operate dangerous machinery while taking antihistamines.  Do not take these medications if you have prostate enlargement.  Read package instructions thoroughly on all medications that you take.  GET HELP RIGHT AWAY IF:   Symptoms don't go away after treatment.  Severe itching that persists.  If you rash spreads or swells.  If you rash begins to smell.  If it blisters and opens or develops a yellow-brown crust.  You develop a fever.  You have a sore throat.  You become short of breath.  MAKE SURE YOU:  Understand these instructions. Will watch your condition. Will get help right away if you are not doing well or get worse.  Thank you for choosing an e-visit. Your e-visit answers were reviewed by a board certified advanced clinical practitioner to complete your  personal care plan. Depending upon the condition, your plan could have included both over the counter or prescription medications. Please review your pharmacy choice. Be sure that the pharmacy you have chosen is open so that you can pick up your prescription now.  If there is a problem you may message your provider in MyChart to have the prescription routed to another pharmacy. Your safety is important to us. If you have drug allergies check your prescription carefully.  For the next 24 hours, you can use MyChart to ask questions about today's visit, request a non-urgent call back, or ask for a work or school excuse from your e-visit provider. You will get an email in the next two days asking about your experience. I hope that your e-visit has been valuable and will speed your recovery.

## 2016-03-23 DIAGNOSIS — H5213 Myopia, bilateral: Secondary | ICD-10-CM | POA: Diagnosis not present

## 2016-03-23 DIAGNOSIS — H527 Unspecified disorder of refraction: Secondary | ICD-10-CM | POA: Diagnosis not present

## 2016-04-03 MED FILL — ADVAIR HFA 115-21 MCG INH: 115-21 | 30 days supply | Qty: 12 | Fill #1

## 2016-05-10 ENCOUNTER — Encounter: Payer: Self-pay | Admitting: Podiatry

## 2016-05-10 ENCOUNTER — Ambulatory Visit (INDEPENDENT_AMBULATORY_CARE_PROVIDER_SITE_OTHER): Payer: 59 | Admitting: Podiatry

## 2016-05-10 DIAGNOSIS — L600 Ingrowing nail: Secondary | ICD-10-CM

## 2016-05-10 NOTE — Patient Instructions (Signed)

## 2016-05-11 NOTE — Progress Notes (Signed)
Subjective:     Patient ID: Michael Santos, male   DOB: 15-Sep-1995, 20 y.o.   MRN: [id number]  HPI patient presents with painful ingrown toenail left big toe of chronic nature   Review of Systems     Objective:   Physical Exam Neurovascular status intact with incurvated left hallux medial lateral border that's painful when pressed with redness and has tried to trim it and soak it    Assessment:     Chronic ingrown toenail deformity left hallux    Plan:     Reviewed and recommended removal of the corners and explained procedure. Today I infiltrated the hallux 60 Milligan times like Marcaine mixture remove the corners exposed matrix and applied phenol 3 applications 30 seconds followed by alcohol lavage and sterile dressing. Gave instructions on soaks and reappoint

## 2016-06-07 MED FILL — ADVAIR HFA 115-21 MCG INH: 115-21 | 30 days supply | Qty: 12 | Fill #2

## 2016-08-03 ENCOUNTER — Encounter: Payer: Self-pay | Admitting: Osteopathic Medicine

## 2016-08-03 ENCOUNTER — Ambulatory Visit (INDEPENDENT_AMBULATORY_CARE_PROVIDER_SITE_OTHER): Payer: 59 | Admitting: Osteopathic Medicine

## 2016-08-03 VITALS — BP 136/76 | HR 71 | Ht 72.0 in | Wt 236.0 lb

## 2016-08-03 DIAGNOSIS — Z8669 Personal history of other diseases of the nervous system and sense organs: Secondary | ICD-10-CM | POA: Insufficient documentation

## 2016-08-03 DIAGNOSIS — J4540 Moderate persistent asthma, uncomplicated: Secondary | ICD-10-CM | POA: Diagnosis not present

## 2016-08-03 DIAGNOSIS — Z23 Encounter for immunization: Secondary | ICD-10-CM

## 2016-08-03 DIAGNOSIS — J309 Allergic rhinitis, unspecified: Secondary | ICD-10-CM

## 2016-08-03 DIAGNOSIS — Z8249 Family history of ischemic heart disease and other diseases of the circulatory system: Secondary | ICD-10-CM

## 2016-08-03 DIAGNOSIS — Z87730 Personal history of (corrected) cleft lip and palate: Secondary | ICD-10-CM

## 2016-08-03 NOTE — Progress Notes (Signed)
HPI: Michael Santos is a 20 y.o. male  who [facility] today, 08/03/16,  for chief complaint of:  Chief Complaint  Patient presents with  . Establish Care    Will be starting school and requests vaccination records. Vaccine records printed from state database, up-to-date on required vaccines.  Asthma: stable on Advair. Has not needed rescue inhaler in two months. Does not need refills at this moment that he is aware of  Allergic rhinitis: controlled with Zyrtec and Flonase.  Previous medical history reviewed as below, pertinent information includes father with heart attack/heart disease at young age, may have been substance abuse involved but patient is not sure, that apparently had several MIs.   Past medical, surgical, social and family history reviewed: Patient Active Problem List   Diagnosis Date Noted  . History of chronic ear infection 08/03/2016  . Allergic rhinitis 06/11/2013  . Reactive airway disease 06/11/2013   Past Surgical History:  Procedure Laterality Date  . CLEFT PALATE REPAIR    . TYMPANOSTOMY TUBE PLACEMENT     Social History  Substance Use Topics  . Smoking status: Never Smoker  . Smokeless tobacco: Never Used  . Alcohol use No   Family History  Problem Relation Age of Onset  . Cleft palate Father   . Hyperlipidemia Father   . Hypertension Father   . Asthma Father   . Alcohol abuse Father   . Diabetes Father   . COPD Father   . Hypertension Mother   . Depression Mother   . Cleft palate Paternal Grandfather   . Cancer Maternal Grandmother      Current medication list and allergy/intolerance information reviewed:   Current Outpatient Prescriptions  Medication Sig Dispense Refill  . cetirizine (ZYRTEC) 10 MG tablet Take 10 mg by mouth daily.    . fluticasone (FLONASE) 50 MCG/ACT nasal spray Place 2 sprays into both nostrils daily. 16 g 0  . fluticasone-salmeterol (ADVAIR HFA) 115-21 MCG/ACT  inhaler Inhale 2 puffs into the lungs 2 (two) times daily. 1 Inhaler 5   No current facility-administered medications for this visit.    Allergies  Allergen Reactions  . Dust Mite Extract   . Pollen Extract       Review of Systems:  Constitutional:  No  fever, no chills, No recent illness, No unintentional weight changes. No significant fatigue.   HEENT: No headache, no vision change, no hearing change, No sore throat, No  sinus pressure  Cardiac: No  chest pain, No  pressure, No palpitations, No  Orthopnea  Respiratory:  No  shortness of breath. No  Cough  Gastrointestinal: No  abdominal pain, No  nausea, No  vomiting,  No  blood in stool, No  diarrhea, No  constipation   Musculoskeletal: No new myalgia/arthralgia  Genitourinary: No  incontinence, No  abnormal genital bleeding, No abnormal genital discharge  Skin: No  Rash, No other wounds/concerning lesions  Hem/Onc: No  easy bruising/bleeding, No  abnormal lymph node  Endocrine: No cold intolerance,  No heat intolerance. No polyuria/polydipsia/polyphagia   Neurologic: No  weakness, No  dizziness, No  slurred speech/focal weakness/facial droop  Psychiatric: No  concerns with depression, No  concerns with anxiety, No sleep problems, No mood problems  Exam:  BP 136/76   Pulse 71   Ht 6' (1.829 m)   Wt 236 lb (107 kg)   BMI 32.01 kg/m   Constitutional: VS see above. General Appearance: alert, well-developed, well-nourished, NAD  Eyes: Normal lids and conjunctive, non-icteric sclera  Ears, Nose, Mouth, Throat: MMM, Normal external inspection ears/nares/mouth/lips/gums. TM normal bilaterally. Pharynx/tonsils no erythema, no exudate. Nasal mucosa normal. Postsurgical changes from cleft palate repair, well-healed  Neck: No masses, trachea midline. No thyroid enlargement. No tenderness/mass appreciated. No lymphadenopathy  Respiratory: Normal respiratory effort. no wheeze, no rhonchi, no rales  Cardiovascular: S1/S2  normal, no murmur, no rub/gallop auscultated. RRR. No lower extremity edema.   Gastrointestinal: Nontender, no masses. No hepatomegaly, no splenomegaly. No hernia appreciated. Bowel sounds normal. Rectal exam deferred.   Musculoskeletal: Gait normal. No clubbing/cyanosis of digits.   Neurological: Normal balance/coordination. No tremor.  Skin: warm, dry, intact. No rash/ulcer  Psychiatric: Normal judgment/insight. Normal mood and affect. Oriented x3.      ASSESSMENT/PLAN:   Moderate persistent reactive airway disease without complication  Need for prophylactic vaccination and inoculation against influenza - Plan: Flu Vaccine QUAD 36+ mos IM  History of chronic ear infection  Allergic rhinitis, unspecified chronicity, unspecified seasonality, unspecified trigger  Family history of heart attack - Plan: COMPLETE METABOLIC PANEL WITH GFR, Lipid panel    Patient Instructions  For vaccines: You have been provided with a copy of your vaccine records. Please review the paperwork for your school to see which ones may be required but it looks like your up-to-date on everything. Typically, schools may need paperwork filled out by a medical provider, you can drop these papers off at our office and I will complete these and let you know if there is anything else we need to catch up on.   Due to family history, we should check cholesterol and other routine blood work for you. You have been given a lab slip to go downstairs to the lab at your earliest convenience, please be fasting, that means no food or anything to drink other than water for 8-12 hours prior to her blood drawn. You do not need an appointment to go to the lab downstairs. We will call you in 1-2 business days after your blood drawn, please let us know if you don't hear back about your results.  For asthma/allergies: Please have your pharmacy contact us with any refill requests, this process can take a few days. Please do not wait  until you're totally out of your medications.  Any other questions or concerns, please do not hesitate to contact us.    Visit summary with medication list and pertinent instructions was printed for patient to review. All questions at time of visit were answered - patient instructed to contact office with any additional concerns. ER/RTC precautions were reviewed with the patient. Follow-up plan: Return in about 1 year (around 08/03/2017) for ASTHMA, ANNUAL PHYSICAL or sooner if needed.

## 2016-08-03 NOTE — Patient Instructions (Addendum)
For vaccines: You have been provided with a copy of your vaccine records. Please review the paperwork for your school to see which ones may be required but it looks like your up-to-date on everything. Typically, schools may need paperwork filled out by a medical provider, you can drop these papers off at our office and I will complete these and let you know if there is anything else we need to catch up on.   Due to family history, we should check cholesterol and other routine blood work for you. You have been given a lab slip to go downstairs to the lab at your earliest convenience, please be fasting, that means no food or anything to drink other than water for 8-12 hours prior to her blood drawn. You do not need an appointment to go to the lab downstairs. We will call you in 1-2 business days after your blood drawn, please let us know if you don't hear back about your results.  For asthma/allergies: Please have your pharmacy contact us with any refill requests, this process can take a few days. Please do not wait until you're totally out of your medications.  Any other questions or concerns, please do not hesitate to contact us.

## 2016-08-17 ENCOUNTER — Ambulatory Visit: Payer: 59

## 2016-08-17 DIAGNOSIS — Z8249 Family history of ischemic heart disease and other diseases of the circulatory system: Secondary | ICD-10-CM | POA: Diagnosis not present

## 2016-08-17 LAB — COMPLETE METABOLIC PANEL WITH GFR
ALBUMIN: 4.4 g/dL (ref 3.6–5.1)
ALK PHOS: 103 U/L (ref 40–115)
ALT: 39 U/L (ref 9–46)
AST: 22 U/L (ref 10–40)
BUN: 14 mg/dL (ref 7–25)
CHLORIDE: 103 mmol/L (ref 98–110)
CO2: 27 mmol/L (ref 20–31)
Calcium: 9.4 mg/dL (ref 8.6–10.3)
Creat: 1.2 mg/dL (ref 0.60–1.35)
GFR, Est Non African American: 87 mL/min (ref >=60)
Glucose, Bld: 81 mg/dL (ref 65–99)
Potassium: 4.6 mmol/L (ref 3.5–5.3)
Sodium: 139 mmol/L (ref 135–146)
Total Bilirubin: 2 mg/dL — ABNORMAL HIGH (ref 0.2–1.2)
Total Protein: 7.2 g/dL (ref 6.1–8.1)

## 2016-08-17 LAB — LIPID PANEL
Cholesterol: 145 mg/dL (ref <200)
HDL: 36 mg/dL — ABNORMAL LOW (ref >40)
LDL Cholesterol: 96 mg/dL (ref <100)
TRIGLYCERIDES: 66 mg/dL (ref <150)
Total CHOL/HDL Ratio: 4 Ratio (ref <5.0)
VLDL: 13 mg/dL (ref <30)

## 2016-08-21 ENCOUNTER — Telehealth: Payer: 59 | Admitting: Family

## 2016-08-21 DIAGNOSIS — J029 Acute pharyngitis, unspecified: Secondary | ICD-10-CM | POA: Diagnosis not present

## 2016-08-21 MED ORDER — BENZONATATE 100 MG PO CAPS: 100.0000 mg | ORAL_CAPSULE | Freq: Three times a day (TID) | ORAL | 0 refills | Status: DC | PRN

## 2016-08-21 MED ORDER — PREDNISONE 5 MG PO TABS: 5.0000 mg | ORAL_TABLET | ORAL | 0 refills | Status: DC

## 2016-08-21 NOTE — Progress Notes (Signed)
We are sorry that you are not feeling well.  Here is how we plan to help!  Based on what you have shared with me it looks like you have upper respiratory tract inflammation that has resulted in a significant cough.  Inflammation and infection in the upper respiratory tract is commonly called bronchitis and has four common causes:  Allergies, Viral Infections, Acid Reflux and Bacterial Infections.  Allergies, viruses and acid reflux are treated by controlling symptoms or eliminating the cause. An example might be a cough caused by taking certain blood pressure medications. You stop the cough by changing the medication. Another example might be a cough caused by acid reflux. Controlling the reflux helps control the cough.  Based on your presentation I believe you most likely have A cough due to a virus.  This is called viral bronchitis and is best treated by rest, plenty of fluids and control of the cough.  You may use Ibuprofen or Tylenol as directed to help your symptoms.     In addition you may use A non-prescription cough medication called Mucinex DM: take 2 tablets every 12 hours. and A prescription cough medication called Tessalon Perles 100mg. You may take 1-2 capsules every 8 hours as needed for your cough.  Sterapred 5 mg dosepak  USE OF BRONCHODILATOR ("RESCUE") INHALERS: There is a risk from using your bronchodilator too frequently.  The risk is that over-reliance on a medication which only relaxes the muscles surrounding the breathing tubes can reduce the effectiveness of medications prescribed to reduce swelling and congestion of the tubes themselves.  Although you feel brief relief from the bronchodilator inhaler, your asthma may actually be worsening with the tubes becoming more swollen and filled with mucus.  This can delay other crucial treatments, such as oral steroid medications. If you need to use a bronchodilator inhaler daily, several times per day, you should discuss this with your  provider.  There are probably better treatments that could be used to keep your asthma under control.     HOME CARE . Only take medications as instructed by your medical team. . Complete the entire course of an antibiotic. . Drink plenty of fluids and get plenty of rest. . Avoid close contacts especially the very young and the elderly . Cover your mouth if you cough or cough into your sleeve. . Always remember to wash your hands . A steam or ultrasonic humidifier can help congestion.   GET HELP RIGHT AWAY IF: . You develop worsening fever. . You become short of breath . You cough up blood. . Your symptoms persist after you have completed your treatment plan MAKE SURE YOU   Understand these instructions.  Will watch your condition.  Will get help right away if you are not doing well or get worse.  Your e-visit answers were reviewed by a board certified advanced clinical practitioner to complete your personal care plan.  Depending on the condition, your plan could have included both over the counter or prescription medications. If there is a problem please reply  once you have received a response from your provider. Your safety is important to us.  If you have drug allergies check your prescription carefully.    You can use MyChart to ask questions about today's visit, request a non-urgent call back, or ask for a work or school excuse for 24 hours related to this e-Visit. If it has been greater than 24 hours you will need to follow up with your provider,   or enter a new e-Visit to address those concerns. You will get an e-mail in the next two days asking about your experience.  I hope that your e-visit has been valuable and will speed your recovery. Thank you for using e-visits.   

## 2016-08-22 MED FILL — ADVAIR HFA 115-21 MCG INH: 115-21 | 30 days supply | Qty: 12 | Fill #3

## 2016-08-27 ENCOUNTER — Telehealth: Payer: 59 | Admitting: Family

## 2016-08-27 DIAGNOSIS — J209 Acute bronchitis, unspecified: Secondary | ICD-10-CM

## 2016-08-27 MED ORDER — BENZONATATE 100 MG PO CAPS: 100.0000 mg | ORAL_CAPSULE | Freq: Two times a day (BID) | ORAL | 0 refills | Status: DC | PRN

## 2016-08-27 MED ORDER — AZITHROMYCIN 250 MG PO TABS: ORAL_TABLET | ORAL | 0 refills | Status: DC

## 2016-08-27 NOTE — Progress Notes (Signed)

## 2016-09-08 ENCOUNTER — Telehealth: Payer: 59 | Admitting: Family

## 2016-09-08 DIAGNOSIS — J329 Chronic sinusitis, unspecified: Secondary | ICD-10-CM | POA: Diagnosis not present

## 2016-09-08 DIAGNOSIS — B9789 Other viral agents as the cause of diseases classified elsewhere: Secondary | ICD-10-CM

## 2016-09-08 MED ORDER — FLUTICASONE PROPIONATE 50 MCG/ACT NA SUSP: 2.0000 | Freq: Every day | NASAL | 6 refills | Status: AC

## 2016-09-08 NOTE — Progress Notes (Signed)

## 2016-09-09 ENCOUNTER — Encounter: Payer: Self-pay | Admitting: *Deleted

## 2016-09-09 ENCOUNTER — Emergency Department
Admission: EM | Admit: 2016-09-09 | Discharge: 2016-09-09 | Disposition: A | Discharge status: Home / Self Care | Payer: 59 | Source: Home / Self Care | Attending: Family Medicine | Admitting: Family Medicine

## 2016-09-09 DIAGNOSIS — H6591 Unspecified nonsuppurative otitis media, right ear: Secondary | ICD-10-CM | POA: Diagnosis not present

## 2016-09-09 DIAGNOSIS — H6505 Acute serous otitis media, recurrent, left ear: Secondary | ICD-10-CM

## 2016-09-09 MED ORDER — AMOXICILLIN-POT CLAVULANATE 875-125 MG PO TABS: 1.0000 | ORAL_TABLET | Freq: Two times a day (BID) | ORAL | 0 refills | Status: DC

## 2016-09-09 MED ORDER — PREDNISONE 20 MG PO TABS: ORAL_TABLET | ORAL | 0 refills | Status: DC

## 2016-09-09 NOTE — ED Provider Notes (Signed)
[facility]    CSN: [id number] Arrival date & time: 09/09/16  1234     History   Chief Complaint Chief Complaint  Patient presents with  . Otalgia    HPI Michael Santos is a 21 y.o. male.   Patient complains of right ear feeling clogged for a week, and developed right earache yesterday.  No fever or URI symptoms.  He has a past history of recurrent otitis media and ear tubes.   The history is provided by the patient.    Past Medical History:  Diagnosis Date  . Allergy   . Asthma     Patient Active Problem List   Diagnosis Date Noted  . History of chronic ear infection 08/03/2016  . History of cleft palate 08/03/2016  . Allergic rhinitis 06/11/2013  . Asthma 06/11/2013    Past Surgical History:  Procedure Laterality Date  . CLEFT PALATE REPAIR    . TYMPANOSTOMY TUBE PLACEMENT         Home Medications    Prior to Admission medications   Medication Sig Start Date End Date Taking? Authorizing Provider  cetirizine (ZYRTEC) 10 MG tablet Take 10 mg by mouth daily.   Yes Historical Provider, MD  fluticasone-salmeterol (ADVAIR HFA) 115-21 MCG/ACT inhaler Inhale 2 puffs into the lungs 2 (two) times daily. 12/16/15  Yes Collie Siad English, PA  amoxicillin-clavulanate (AUGMENTIN) 875-125 MG tablet Take 1 tablet by mouth 2 (two) times daily. Take with food 09/09/16   Lattie Haw, MD  fluticasone (FLONASE) 50 MCG/ACT nasal spray Place 2 sprays into both nostrils daily. 09/08/16   Junie Spencer, FNP  predniSONE (DELTASONE) 20 MG tablet Take one tab by mouth twice daily for 5 days, then one daily. Take with food. 09/09/16   Lattie Haw, MD    Family History Family History  Problem Relation Age of Onset  . Cleft palate Father   . Hyperlipidemia Father   . Hypertension Father   . Asthma Father   . Alcohol abuse Father   . Diabetes Father   . COPD Father   . Heart disease Father   . Hypertension Mother   . Depression Mother   . Cleft palate  Paternal Grandfather   . Cancer Maternal Grandmother     Social History Social History  Substance Use Topics  . Smoking status: Never Smoker  . Smokeless tobacco: Never Used  . Alcohol use No     Allergies   Dust mite extract and Pollen extract   Review of Systems Review of Systems  No sore throat No cough No pleuritic pain No wheezing + nasal congestion ? post-nasal drainage No sinus pain/pressure No itchy/red eyes + earache No hemoptysis No SOB No fever/chills No nausea No vomiting No abdominal pain No diarrhea No urinary symptoms No skin rash No fatigue No myalgias + headache Used OTC meds without relief    Physical Exam Triage Vital Signs ED Triage Vitals  Enc Vitals Group     BP 09/09/16 1341 138/77     Pulse Rate 09/09/16 1341 79     Resp 09/09/16 1341 16     Temp 09/09/16 1341 98.2 F (36.8 C)     Temp Source 09/09/16 1341 Oral     SpO2 09/09/16 1341 99 %     Weight 09/09/16 1341 235 lb (106.6 kg)     Height --      Head Circumference --      Peak Flow --  Pain Score 09/09/16 1343 2     Pain Loc --      Pain Edu? --      Excl. in GC? --    No data found.   Updated Vital Signs BP 138/77 (BP Location: Left Arm)   Pulse 79   Temp 98.2 F (36.8 C) (Oral)   Resp 16   Wt 235 lb (106.6 kg)   SpO2 99%   BMI 31.87 kg/m   Visual Acuity Right Eye Distance:   Left Eye Distance:   Bilateral Distance:    Right Eye Near:   Left Eye Near:    Bilateral Near:     Physical Exam Nursing notes and Vital Signs reviewed. Appearance:  Patient appears stated age, and in no acute distress Eyes:  Pupils are equal, round, and reactive to light and accomodation.  Extraocular movement is intact.  Conjunctivae are not inflamed  Ears:  Canals normal.  Left tympanic membrane has serous effusion.  Right tympanic membrane is erythematous with effusion. Nose:   Congested turbinates.  No sinus tenderness.     Pharynx:  Normal Neck:  Supple.  No  adenopathy. Lungs:  Clear to auscultation.  Breath sounds are equal.  Moving air well. Heart:  Regular rate and rhythm without murmurs, rubs, or gallops.  Abdomen:  Nontender without masses or hepatosplenomegaly.  Bowel sounds are present.  No CVA or flank tenderness.  Extremities:  No edema.  Skin:  No rash present.    UC Treatments / Results  Labs (all labs ordered are listed, but only abnormal results are displayed) Labs Reviewed - No data to display  EKG  EKG Interpretation None       Radiology No results found.  Procedures Procedures (including critical care time)  Medications Ordered in UC Medications - No data to display   Initial Impression / Assessment and Plan / UC Course  I have reviewed the triage vital signs and the nursing notes.  Pertinent labs & imaging results that were available during my care of the patient were reviewed by me and considered in my medical decision making (see chart for details).   Begin prednisone burst/taper, and Augmentin. Continue plain guaifenesin (1200mg  extended release tabs such as Mucinex) twice daily, with plenty of water, for congestion.  May add Pseudoephedrine (30mg , one or two every 4 to 6 hours) for sinus congestion.  Get adequate rest.   May use Afrin nasal spray (or generic oxymetazoline) twice daily for about 5 days and then discontinue.  Also recommend using saline nasal spray several times daily and saline nasal irrigation (AYR is a common brand).  Use Flonase nasal spray each morning after using Afrin nasal spray and saline nasal irrigation. Folowup with ENT if not improved 10 days.        Final Clinical Impressions(s) / UC Diagnoses   Final diagnoses:  Right otitis media with effusion  Recurrent acute serous otitis media of left ear    New Prescriptions Discharge Medication List as of 09/09/2016  2:57 PM    START taking these medications   Details  amoxicillin-clavulanate (AUGMENTIN) 875-125 MG tablet Take 1  tablet by mouth 2 (two) times daily. Take with food, Starting Sat 09/09/2016, Print    predniSONE (DELTASONE) 20 MG tablet Take one tab by mouth twice daily for 5 days, then one daily. Take with food., Print         Lattie HawStephen A Beese, MD 09/26/16 [phone]

## 2016-09-09 NOTE — Discharge Instructions (Signed)
Continue plain guaifenesin (1200mg  extended release tabs such as Mucinex) twice daily, with plenty of water, for congestion.  May add Pseudoephedrine (30mg , one or two every 4 to 6 hours) for sinus congestion.  Get adequate rest.   May use Afrin nasal spray (or generic oxymetazoline) twice daily for about 5 days and then discontinue.  Also recommend using saline nasal spray several times daily and saline nasal irrigation (AYR is a common brand).  Use Flonase nasal spray each morning after using Afrin nasal spray and saline nasal irrigation.

## 2016-09-09 NOTE — ED Triage Notes (Signed)
Patient c/o right ear pain x 1 week.  

## 2016-10-24 MED FILL — ADVAIR HFA 115-21 MCG INH: 115-21 | 30 days supply | Qty: 12 | Fill #4

## 2016-11-06 IMAGING — CR DG LUMBAR SPINE COMPLETE 4+V
5 series · 5 of 5 positions shown · non-contrast
Comparison: None.

CLINICAL DATA: Lower left side and back pain

EXAM:
LUMBAR SPINE - COMPLETE 4+ VIEW

[AP (1 of 3)]
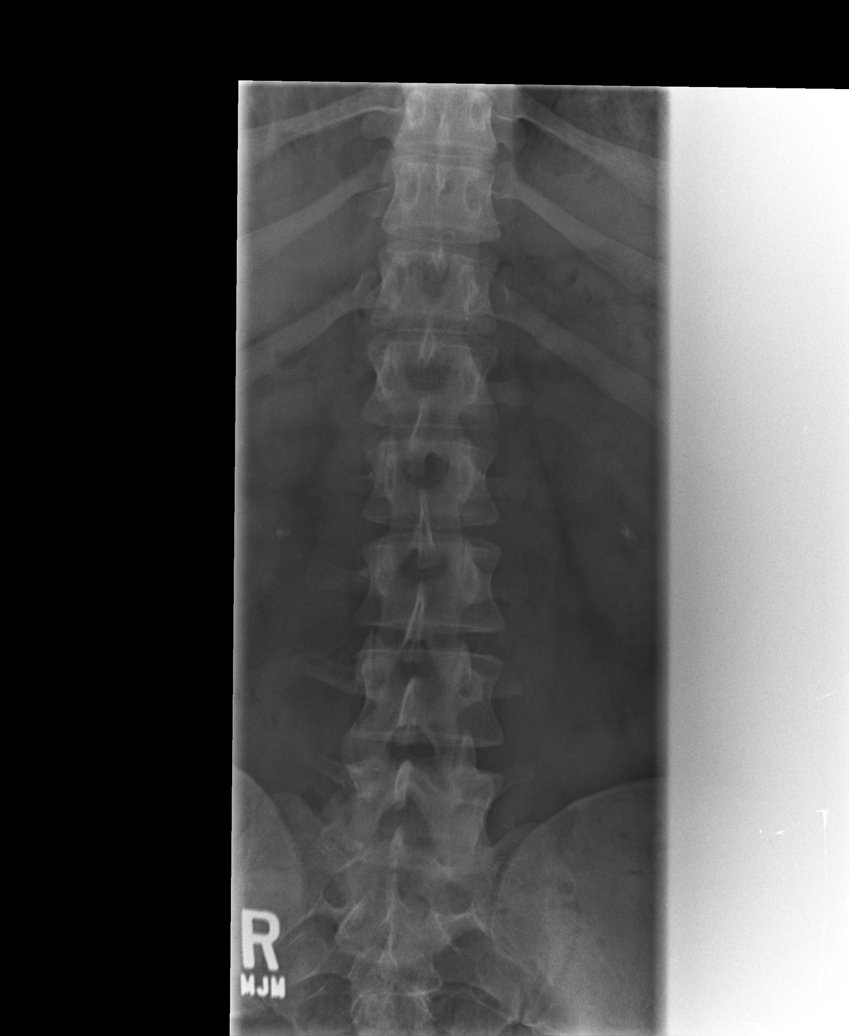

[lpo]
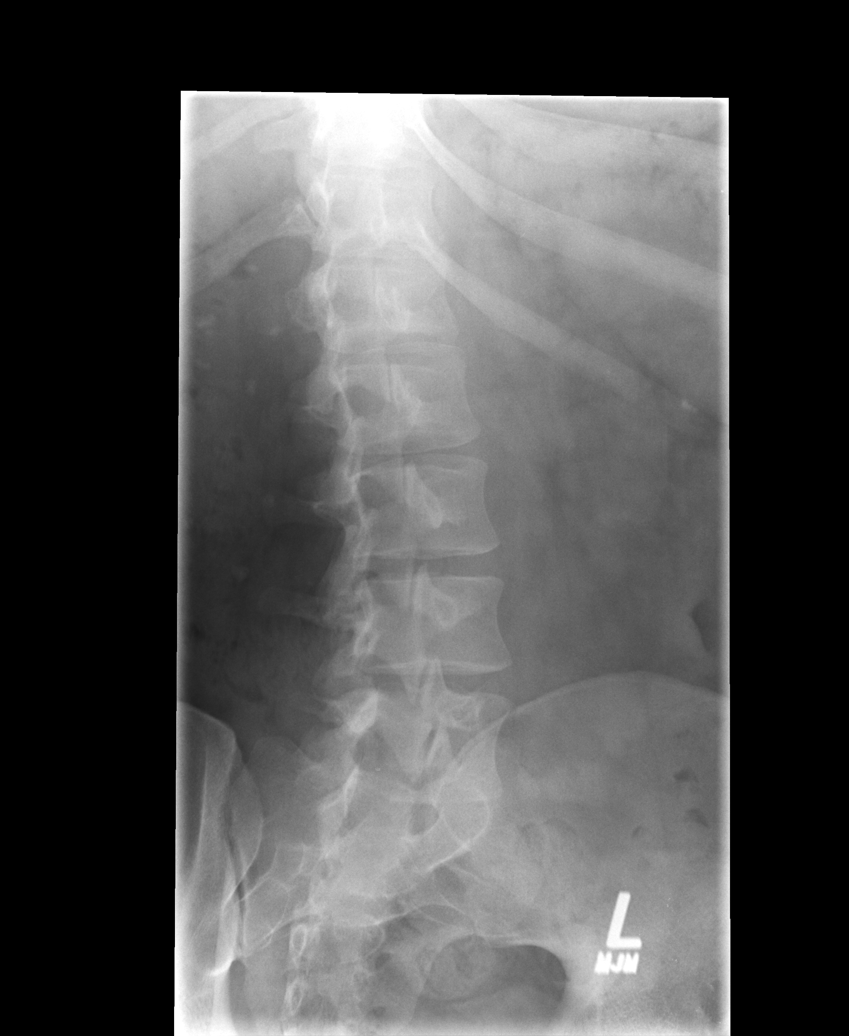

[AP (2 of 3)]
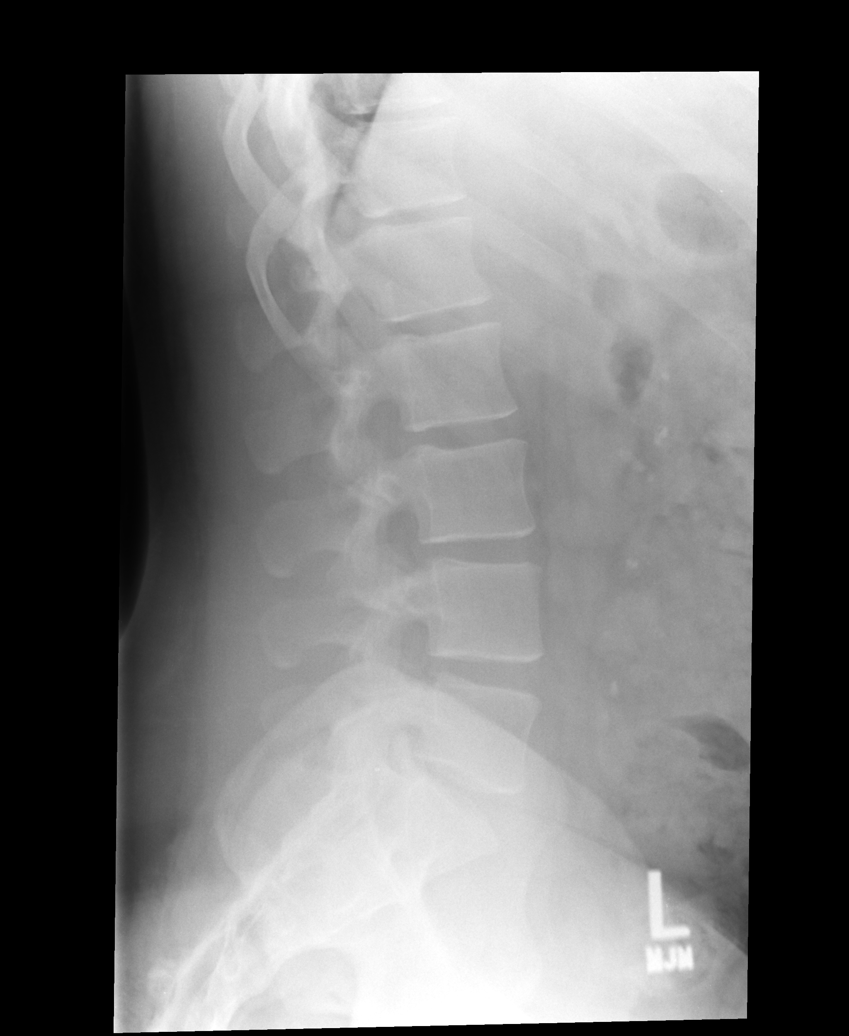

[AP (3 of 3)]
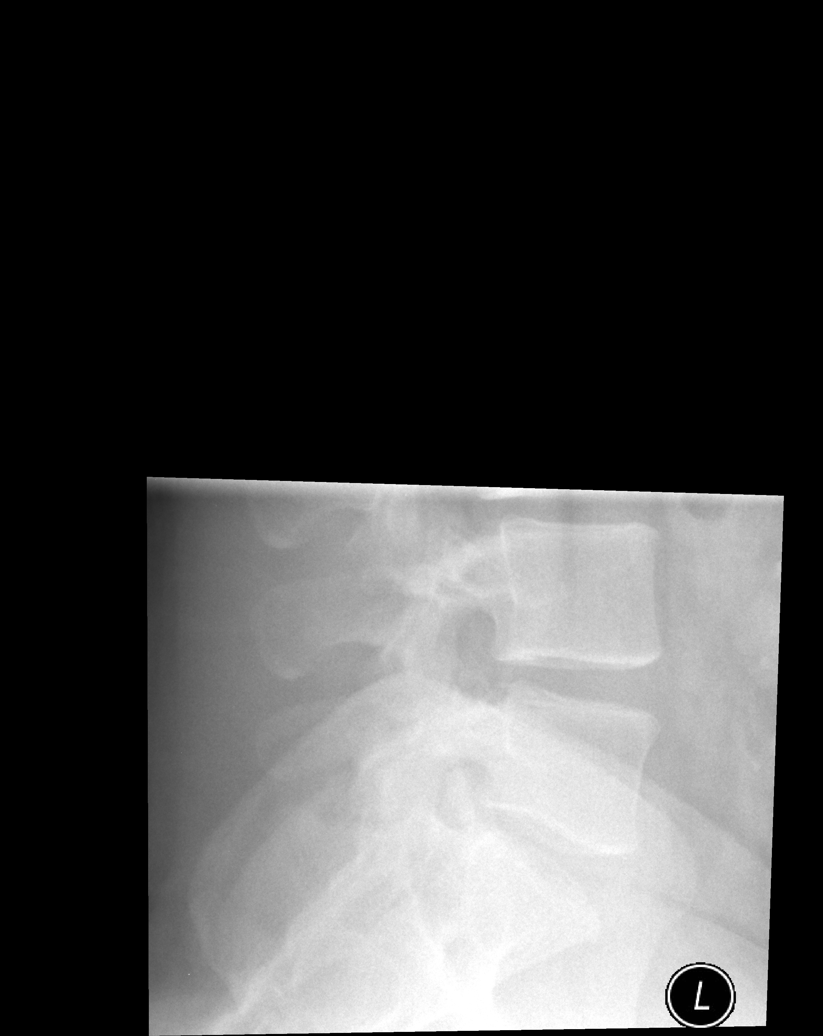

[rpo]
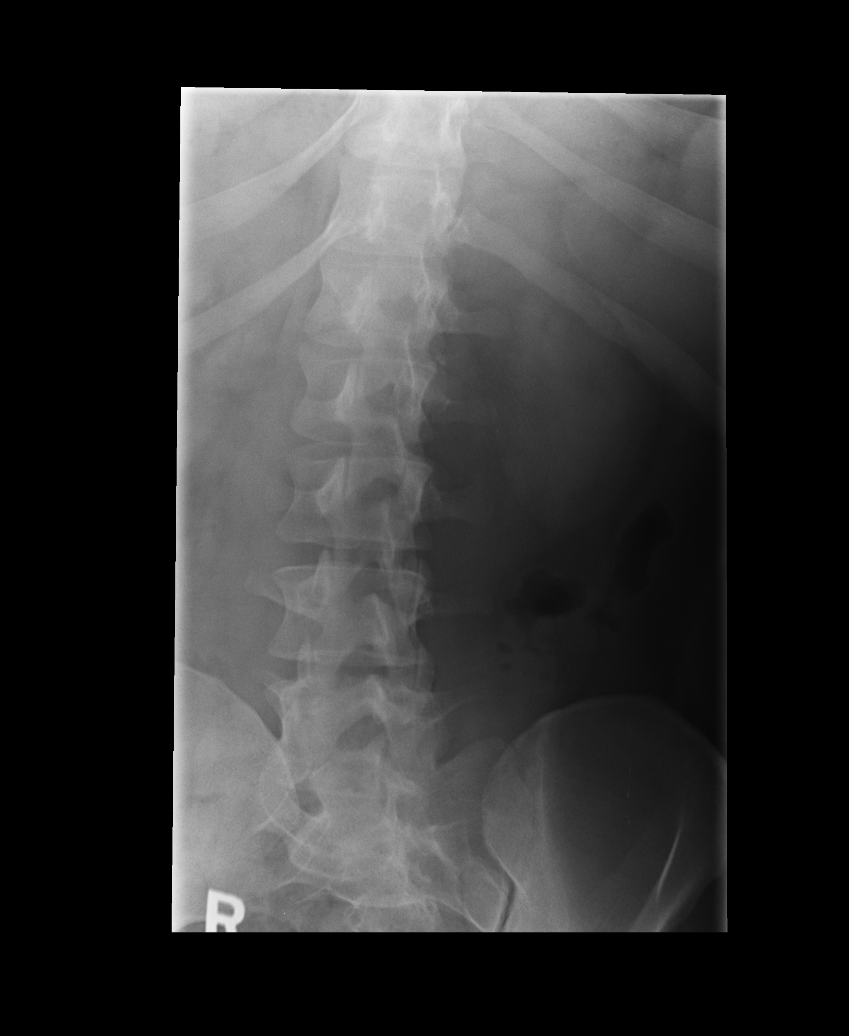

[5 of 5 positions shown; findings below may reference images not displayed]

FINDINGS: There is no evidence of lumbar spine fracture. Alignment is normal.
Intervertebral disc spaces are maintained. Bilateral renal
calcifications are identified.
IMPRESSION: 1. Normal appearance of the lumbar spine.
2. Bilateral nephrolithiasis.

## 2017-02-28 ENCOUNTER — Ambulatory Visit (INDEPENDENT_AMBULATORY_CARE_PROVIDER_SITE_OTHER): Payer: 59 | Admitting: Osteopathic Medicine

## 2017-02-28 ENCOUNTER — Encounter: Payer: Self-pay | Admitting: Osteopathic Medicine

## 2017-02-28 VITALS — BP 114/70 | HR 74 | Wt 218.0 lb

## 2017-02-28 DIAGNOSIS — J4540 Moderate persistent asthma, uncomplicated: Secondary | ICD-10-CM | POA: Diagnosis not present

## 2017-02-28 DIAGNOSIS — F331 Major depressive disorder, recurrent, moderate: Secondary | ICD-10-CM | POA: Insufficient documentation

## 2017-02-28 DIAGNOSIS — J45909 Unspecified asthma, uncomplicated: Secondary | ICD-10-CM | POA: Insufficient documentation

## 2017-02-28 MED ORDER — BUPROPION HCL ER (XL) 150 MG PO TB24: 150.0000 mg | ORAL_TABLET | Freq: Every day | ORAL | 1 refills | Status: DC

## 2017-02-28 MED ORDER — BECLOMETHASONE DIPROPIONATE 40 MCG/ACT IN AERS: 1.0000 | INHALATION_SPRAY | Freq: Two times a day (BID) | RESPIRATORY_TRACT | 12 refills | Status: DC

## 2017-02-28 NOTE — Patient Instructions (Signed)
We are starting a medication today called Wellbutrin to help treat your anxiety and depression. This is a daily medication to help control your symptoms. I also highly encourage my patients who are suffering from anxiety and depression to seek care with a counselor or therapist. A therapist can coach you in techniques to recognize and deal with troubling thought patterns and behaviors. The ability to cope with external stressors is crucial to overall mental health.   Call or message me in the next 2 weeks. If you're starting to feel some effect/improvement, we can hold off on a dose increase and reevaluate at your office visit.   Let's plan to follow up in the office in 4 - 6 weeks. At that time, we can talk about how well the medicine is working for you, and we can consider increasing the dose, adding another medicine, etc.   If you experience problematic side effects, please let me know ASAP - we can switch the medicine any time, and we don't need an appointment for this.   I have also placed a referral to behavioral health for counseling/therapy. Please let us know if you don't hear back about that referral.   Any questions or concerns, call me!

## 2017-02-28 NOTE — Progress Notes (Signed)
HPI: Michael Santos is a 21 y.o. male  who presents [facility] [place], 02/[place]/17,  for chief complaint of:  Chief Complaint  Patient presents with  . Asthma  . Depression    Asthma: Patient has stopped Advair, concern that this was causing worsening depression. Off of the Advair, depression got better but breathing got worse, tried his dad Symbicort and this caused even worse depression. Currently not taking any inhaled medications for asthma other than rescue inhaler as needed. Reports increasing wheezing with activity, typically daily  Depression: Treated for depression/anxiety with Zoloft in college but was only on it for about a week and then stopped due to significant side effects including mood swings and worsening of depression. No history of elation/increase activity/euphoria or impulsive behavior consistent with mood disorder.   Past medical, surgical, social and family history reviewed: Patient Active Problem List   Diagnosis Date Noted  . History of chronic ear infection 08/03/2016  . History of cleft palate 08/03/2016  . Allergic rhinitis 06/11/2013  . Asthma 06/11/2013   Past Surgical History:  Procedure Laterality Date  . CLEFT PALATE REPAIR    . TYMPANOSTOMY TUBE PLACEMENT     Social History  Substance Use Topics  . Smoking status: Never Smoker  . Smokeless tobacco: Never Used  . Alcohol use No   Family History  Problem Relation Age of Onset  . Cleft palate Father   . Hyperlipidemia Father   . Hypertension Father   . Asthma Father   . Alcohol abuse Father   . Diabetes Father   . COPD Father   . Heart disease Father   . Hypertension Mother   . Depression Mother   . Cleft palate Paternal Grandfather   . Cancer Maternal Grandmother      Current medication list and allergy/intolerance information reviewed:   Current Outpatient Prescriptions  Medication Sig Dispense Refill  . cetirizine (ZYRTEC) 10 MG tablet Take 10  mg by mouth daily.    . fluticasone (FLONASE) 50 MCG/ACT nasal spray Place 2 sprays into both nostrils daily. 16 g 6  . fluticasone-salmeterol (ADVAIR HFA) 115-21 MCG/ACT inhaler Inhale 2 puffs into the lungs 2 (two) times daily. (Patient not taking: Reported on 02/28/2017) 1 Inhaler 5   No current facility-administered medications for this visit.    Allergies  Allergen Reactions  . Dust Mite Extract   . Pollen Extract       Review of Systems:  Constitutional:  No  fever, no chills, No recent illness   HEENT: No  headache, no vision change  Cardiac: No  chest pain, No  pressure  Respiratory:  + shortness of breath. No  Cough  Gastrointestinal: No  abdominal pain, No  nausea  Musculoskeletal: No new myalgia/arthralgia  Neurologic: No  weakness, No  dizziness  Psychiatric: +concerns with depression, +concerns with anxiety, No sleep problems, No mood problems  Exam:  BP 114/70   Pulse 74   Wt 218 lb (98.9 kg)   BMI 29.57 kg/m   Constitutional: VS see above. General Appearance: alert, well-developed, well-nourished, NAD  Eyes: Normal lids and conjunctive, non-icteric sclera  Ears, Nose, Mouth, Throat: MMM, Normal external inspection ears/nares/mouth/lips/gums.   Neck: No masses, trachea midline.   Respiratory: Normal respiratory effort. no wheeze, no rhonchi, no rales  Cardiovascular: S1/S2 normal, no murmur, no rub/gallop auscultated. RRR.    Neurological: Normal balance/coordination. No tremor  Skin: warm, dry, intact.   Psychiatric: Normal judgment/insight. Normal mood  and affect. Oriented x3. No suicidal or homicidal ideation, reports passive death wish and occasional fleeting thoughts of suicide but no plan, states he would not commit this act but fantasizes about it occasionally. No thought disorder  Depression screen [facility] 2/9 02/28/2017 01/12/2016 11/06/2015  Decreased Interest 2 0 0  Down, Depressed, Hopeless 2 0 0  PHQ - 2 Score 4 0 0  Altered sleeping 1 - -   Tired, decreased energy 3 - -  Change in appetite 1 - -  Feeling bad or failure about yourself  3 - -  Trouble concentrating 1 - -  Moving slowly or fidgety/restless 1 - -  Suicidal thoughts 1 - -  PHQ-9 Score 15 - -     ASSESSMENT/PLAN:   Moderate persistent reactive airway disease without complication - Avoid combination therapy, de-escalate for now to inhaled corticosteroid only with use of rescue inhaler as needed.  Moderate episode of recurrent major depressive disorder (HCC) - Avoid SSRI for now, trial Wellbutrin and counseling recommended. Safety plan discussed - Plan: Ambulatory referral to Behavioral Health     Visit summary with medication list and pertinent instructions was printed for patient to review. All questions at time of visit were answered - patient instructed to contact office with any additional concerns. ER/RTC precautions were reviewed with the patient. Follow-up plan: No Follow-up on file.  Note: Total time spent 25 minutes, greater than 50% of the visit was spent face-to-face counseling and coordinating care for the following: The primary encounter diagnosis was Moderate persistent reactive airway disease without complication. A diagnosis of Moderate episode of recurrent major depressive disorder (HCC) was also pertinent to this visit.Marland Kitchen

## 2017-03-27 ENCOUNTER — Encounter: Payer: Self-pay | Admitting: Osteopathic Medicine

## 2017-03-27 ENCOUNTER — Ambulatory Visit (INDEPENDENT_AMBULATORY_CARE_PROVIDER_SITE_OTHER): Payer: 59 | Admitting: Osteopathic Medicine

## 2017-03-27 VITALS — BP 134/69 | HR 73 | Ht 72.0 in | Wt 220.0 lb

## 2017-03-27 DIAGNOSIS — Z6829 Body mass index (BMI) 29.0-29.9, adult: Secondary | ICD-10-CM | POA: Diagnosis not present

## 2017-03-27 DIAGNOSIS — J4540 Moderate persistent asthma, uncomplicated: Secondary | ICD-10-CM

## 2017-03-27 DIAGNOSIS — F331 Major depressive disorder, recurrent, moderate: Secondary | ICD-10-CM

## 2017-03-27 MED ORDER — BUPROPION HCL ER (XL) 300 MG PO TB24: 300.0000 mg | ORAL_TABLET | Freq: Every day | ORAL | 1 refills | Status: DC

## 2017-03-27 MED FILL — BUPROPION HCL XL 300 MG TAB: 300 | 90 days supply | Qty: 90 | Fill #0 | Status: TO

## 2017-03-27 NOTE — Progress Notes (Signed)
HPI: Michael Santos is a 21 y.o. male  who presents to [facility] today, 03/27/17,  for chief complaint of:  Chief Complaint  Patient presents with  . Depression    Asthma: Patient had stopped Advair, concern that this was causing worsening depression. Off of the Advair, depression got better but breathing got worse, tried his dad's Symbicort and this caused even worse depression. At last visit was not taking any inhaled medications for asthma other than rescue inhaler as needed. Reported increasing wheezing with activity, typically daily. We started ICS only therapy w/ rescue inhaler PRN. Today: doing  Well when he remembers the medication, breathing is much better   Depression: Treated for depression/anxiety with Zoloft in college but was only on it for about a week and then stopped due to significant side effects including mood swings and worsening of depression. No history of elation/increase activity/euphoria or impulsive behavior consistent with mood disorder. Last visit we started Wellbutrin. Today: doing well, would like to try higher dose of the medications since he still has some breakthrough depressive issues on occasion.   New concern - Overweight: has been working on lifestyle changes for weight loss and seems to have plateau-ed. Would like some advice on diet/exercise    Past medical, surgical, social and family history reviewed: Patient Active Problem List   Diagnosis Date Noted  . Moderate episode of recurrent major depressive disorder (HCC) 02/28/2017  . Reactive airway disease 02/28/2017  . History of chronic ear infection 08/03/2016  . History of cleft palate 08/03/2016  . Allergic rhinitis 06/11/2013  . Asthma 06/11/2013   Past Surgical History:  Procedure Laterality Date  . CLEFT PALATE REPAIR    . TYMPANOSTOMY TUBE PLACEMENT     Social History  Substance Use Topics  . Smoking status: Never Smoker  . Smokeless tobacco: Never  Used  . Alcohol use No   Family History  Problem Relation Age of Onset  . Cleft palate Father   . Hyperlipidemia Father   . Hypertension Father   . Asthma Father   . Alcohol abuse Father   . Diabetes Father   . COPD Father   . Heart disease Father   . Hypertension Mother   . Depression Mother   . Cleft palate Paternal Grandfather   . Cancer Maternal Grandmother      Current medication list and allergy/intolerance information reviewed:   Current Outpatient Prescriptions  Medication Sig Dispense Refill  . beclomethasone (QVAR) 40 MCG/ACT inhaler Inhale 1 puff into the lungs 2 (two) times daily. 1 Inhaler 12  . buPROPion (WELLBUTRIN XL) 150 MG 24 hr tablet Take 1 tablet (150 mg total) by mouth daily. 30 tablet 1  . cetirizine (ZYRTEC) 10 MG tablet Take 10 mg by mouth daily.    . fluticasone (FLONASE) 50 MCG/ACT nasal spray Place 2 sprays into both nostrils daily. 16 g 6   No current facility-administered medications for this visit.    Allergies  Allergen Reactions  . Dust Mite Extract   . Pollen Extract       Review of Systems:  Constitutional:  No  fever, no chills, No recent illness   Cardiac: No  chest pain, No  pressure  Respiratory:  No shortness of breath. No  Cough  Gastrointestinal: No  abdominal pain, No  nausea  Psychiatric: +concerns with depression, +concerns with anxiety, No sleep problems, No mood problems  Exam:  BP 134/69   Pulse 73   Ht  6' (1.829 m)   Wt 220 lb (99.8 kg)   BMI 29.84 kg/m   Constitutional: VS see above. General Appearance: alert, well-developed, well-nourished, NAD  Eyes: Normal lids and conjunctive, non-icteric sclera  Ears, Nose, Mouth, Throat: MMM, Normal external inspection ears/nares/mouth/lips/gums.   Neck: No masses, trachea midline.   Respiratory: Normal respiratory effort. no wheeze, no rhonchi, no rales  Cardiovascular: S1/S2 normal, no murmur, no rub/gallop auscultated. RRR.    Neurological: Normal  balance/coordination. No tremor  Skin: warm, dry, intact.   Psychiatric: Normal judgment/insight. Normal mood and affect. Oriented x3. No suicidal or homicidal ideation. No thought disorder  [facility] 2/9 03/27/2017 02/28/2017 01/12/2016  Decreased Interest 2 2 0  Down, Depressed, Hopeless 1 2 0  PHQ - 2 Score 3 4 0  Altered sleeping 0 1 -  Tired, decreased energy 1 3 -  Change in appetite 0 1 -  Feeling bad or failure about yourself  1 3 -  Trouble concentrating 0 1 -  Moving slowly or fidgety/restless 0 1 -  Suicidal thoughts 0 1 -  PHQ-9 Score 5 15 -   GAD 7 : Generalized Anxiety Score 03/27/2017 02/28/2017  Nervous, Anxious, on Edge 1 2  Control/stop worrying 1 2  Worry too much - different things 1 2  Trouble relaxing 1 2  Restless 0 1  Easily annoyed or irritable 1 1  Afraid - awful might happen 0 2  Total GAD 7 Score 5 12      ASSESSMENT/PLAN:   Moderate episode of recurrent major depressive disorder (HCC) - Increased dose Wellbutrin, let me know how this is working. Continue at least 6 mos dicsussed. Glad doing so much beter!   Moderate persistent reactive airway disease without complication - stable, doing well   BMI 29.0-29.9,adult - instructions printed and advice reviewed       Visit summary with medication list and pertinent instructions was printed for patient to review. All questions at time of visit were answered - patient instructed to contact office with any additional concerns. ER/RTC precautions were reviewed with the patient. Follow-up plan: Return in about 6 months (around 09/27/2017) for recheck on Wellbutrin, sooner if needed.  Note: Total time spent 25 minutes, greater than 50% of the visit was spent face-to-face counseling and coordinating care for the following: The primary encounter diagnosis was Moderate episode of recurrent major depressive disorder (HCC). Diagnoses of Moderate persistent reactive airway disease without complication and  BMI 29.0-29.9,adult were also pertinent to this visit..Marland Kitchen

## 2017-03-27 NOTE — Patient Instructions (Signed)
Weight loss: important things to remember  It is hard work! You will have setbacks, but don't get discouraged. The goal is not short-term success, it is long-term health.   Looking at the numbers is important to track your progress and set goals, but how you are feeling and your overall health are the most important things! BMI and pounds and calories and miles logged aren't everything - they are tools to help us reach your goals.  You can do this!!!   Things to remember for exercise for weight loss:   Please note - I am not a certified personal trainer. I can present you with ideas and general workout goals, but an exercise program is largely up to you. Find something you can stick with, and something you enjoy!   As you progress in your exercise regimen think about gradually increasing the following, week by week:   intensity (how strenuous is your workout)  frequency (how often you are exercising)  duration (how many minutes at a time you are exercising)  Walking for 20 minutes a day is certainly better than nothing, but more strenuous exercise will develop better cardiovascular fitness.   interval training (high-intensity alternating with low-intensity, think walk/jog rather than just walk)  muscle strengthening exercises (weight lifting, calisthenics, yoga) - this also helps prevent osteoporosis!   Things to remember for diet changes for weight loss:   Please note - I am not a certified dietician. I can present you with ideas and general diet goals, but a meal plan is largely up to you. I am happy to refer you to a dietician who can give you a detailed meal plan.  Apps/logs are crucial to track how you're eating! It's not realistic to be logging everything you eat forever, but when you're starting a healthy eating lifestyle it's very helpful, and checking in with logs now and then helps you stick to your program!   Calorie restriction with the goal weight loss of no more than one  to one and a half pounds per week.   Increase lean protein such as chicken, fish, turkey.   Decrease fatty foods such as dairy, butter.   Decrease sugary foods. Avoid sugary drinks such as soda or juice.  Increase fiber found in fruit and vegetables.   

## 2017-04-02 ENCOUNTER — Telehealth: Payer: 59 | Admitting: Family

## 2017-04-02 DIAGNOSIS — J40 Bronchitis, not specified as acute or chronic: Secondary | ICD-10-CM

## 2017-04-02 MED ORDER — PREDNISONE 10 MG (21) PO TBPK: ORAL_TABLET | ORAL | 0 refills | Status: DC

## 2017-04-02 NOTE — Progress Notes (Signed)

## 2017-04-12 ENCOUNTER — Ambulatory Visit (INDEPENDENT_AMBULATORY_CARE_PROVIDER_SITE_OTHER): Payer: 59 | Admitting: Licensed Clinical Social Worker

## 2017-04-12 DIAGNOSIS — F330 Major depressive disorder, recurrent, mild: Secondary | ICD-10-CM

## 2017-04-12 NOTE — Progress Notes (Signed)
Met with patient for his initial assessment.  Learned that patient will be going back to [facility] in a couple weeks.  Determined that it does not make sense to establish care here.  Encouraged patient to schedule an appointment with one of the counselors on campus.

## 2017-04-16 ENCOUNTER — Ambulatory Visit (INDEPENDENT_AMBULATORY_CARE_PROVIDER_SITE_OTHER): Payer: 59 | Admitting: Physician Assistant

## 2017-04-16 ENCOUNTER — Ambulatory Visit: Payer: 59

## 2017-04-16 VITALS — BP 121/79 | HR 78

## 2017-04-16 DIAGNOSIS — Z23 Encounter for immunization: Secondary | ICD-10-CM

## 2017-04-16 NOTE — Progress Notes (Signed)
Pt came into clinic today for tdap vaccine. 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 Pt tolerated injection in right deltoid well, no immediate complications. Updated chart and [facility], printed copy given. No further questions/concerns.

## 2017-05-24 ENCOUNTER — Encounter: Payer: Self-pay | Admitting: Osteopathic Medicine

## 2017-05-28 MED ORDER — BECLOMETHASONE DIPROPIONATE 40 MCG/ACT IN AERS: 1.0000 | INHALATION_SPRAY | Freq: Two times a day (BID) | RESPIRATORY_TRACT | 5 refills | Status: DC

## 2017-07-31 MED FILL — BUPROPION HCL XL 300 MG TAB: 300 | 60 days supply | Qty: 60 | Fill #0

## 2017-09-06 ENCOUNTER — Encounter: Payer: Self-pay | Admitting: Osteopathic Medicine

## 2017-09-06 ENCOUNTER — Ambulatory Visit (INDEPENDENT_AMBULATORY_CARE_PROVIDER_SITE_OTHER): Payer: 59 | Admitting: Osteopathic Medicine

## 2017-09-06 VITALS — BP 132/69 | HR 74 | Wt 228.1 lb

## 2017-09-06 DIAGNOSIS — J4540 Moderate persistent asthma, uncomplicated: Secondary | ICD-10-CM | POA: Diagnosis not present

## 2017-09-06 DIAGNOSIS — R17 Unspecified jaundice: Secondary | ICD-10-CM

## 2017-09-06 DIAGNOSIS — Z23 Encounter for immunization: Secondary | ICD-10-CM

## 2017-09-06 DIAGNOSIS — F3342 Major depressive disorder, recurrent, in full remission: Secondary | ICD-10-CM | POA: Diagnosis not present

## 2017-09-06 LAB — COMPLETE METABOLIC PANEL WITH GFR
AG RATIO: 2.1 (calc) (ref 1.0–2.5)
ALBUMIN MSPROF: 4.1 g/dL (ref 3.6–5.1)
ALKALINE PHOSPHATASE (APISO): 99 U/L (ref 40–115)
ALT: 37 U/L (ref 9–46)
AST: 21 U/L (ref 10–40)
BILIRUBIN TOTAL: 1 mg/dL (ref 0.2–1.2)
BUN: 14 mg/dL (ref 7–25)
CHLORIDE: 106 mmol/L (ref 98–110)
CO2: 32 mmol/L (ref 20–32)
CREATININE: 1.21 mg/dL (ref 0.60–1.35)
Calcium: 9.3 mg/dL (ref 8.6–10.3)
GFR, Est African American: 98 mL/min/{1.73_m2} (ref >=60)
GFR, Est Non African American: 84 mL/min/{1.73_m2} (ref >=60)
GLOBULIN: 2 g/dL (ref 1.9–3.7)
Glucose, Bld: 73 mg/dL (ref 65–99)
POTASSIUM: 4.2 mmol/L (ref 3.5–5.3)
SODIUM: 142 mmol/L (ref 135–146)
Total Protein: 6.1 g/dL (ref 6.1–8.1)

## 2017-09-06 MED ORDER — BECLOMETHASONE DIPROPIONATE 40 MCG/ACT IN AERS: 1.0000 | INHALATION_SPRAY | Freq: Two times a day (BID) | RESPIRATORY_TRACT | 11 refills | Status: AC

## 2017-09-06 MED ORDER — BUPROPION HCL ER (XL) 300 MG PO TB24: 300.0000 mg | ORAL_TABLET | Freq: Every day | ORAL | 3 refills | Status: DC

## 2017-09-06 MED FILL — QVAR REDIHALER 40 MCG/ACT A: 40 | 30 days supply | Qty: 11 | Fill #0 | Status: TO

## 2017-09-06 NOTE — Progress Notes (Signed)
HPI: Michael Santos is a 22 y.o. male  who presents to [facility] today, 09/06/17,  for chief complaint of:  Chief Complaint  Patient presents with  . Follow-up    Asthma: We started ICS only therapy w/ rescue inhaler PRN. Today: doing  Well when he remembers the medication, breathing is much better. Would like to stay on Qvar   Depression: Previous visit we started Wellbutrin. Last visit: was doing well, would like to try higher dose of the Wellbutrin since he still has some breakthrough depressive issues on occasion, so we went up to 300 mg dose. This visit: doing well and would like to continue meds, left them at home on recent trip to [place] and did not like moods off the medications. Doing well on school, maintaining B average   Labs a year ago no concerns, slightly elevated bilirubin  Preventive care: no changes FH, no need for STI screening   Past medical, surgical, social and family history reviewed: Patient Active Problem List   Diagnosis Date Noted  . Moderate episode of recurrent major depressive disorder (HCC) 02/28/2017  . Reactive airway disease 02/28/2017  . History of chronic ear infection 08/03/2016  . History of cleft palate 08/03/2016  . Allergic rhinitis 06/11/2013  . Asthma 06/11/2013   Past Surgical History:  Procedure Laterality Date  . CLEFT PALATE REPAIR    . TYMPANOSTOMY TUBE PLACEMENT     Social History   Tobacco Use  . Smoking status: Never Smoker  . Smokeless tobacco: Never Used  Substance Use Topics  . Alcohol use: No   Family History  Problem Relation Age of Onset  . Cleft palate Father   . Hyperlipidemia Father   . Hypertension Father   . Asthma Father   . Alcohol abuse Father   . Diabetes Father   . COPD Father   . Heart disease Father   . Hypertension Mother   . Depression Mother   . Cleft palate Paternal Grandfather   . Cancer Maternal Grandmother      Current medication list and  allergy/intolerance information reviewed:   Current Outpatient Medications  Medication Sig Dispense Refill  . beclomethasone (QVAR) 40 MCG/ACT inhaler Inhale 1 puff into the lungs 2 (two) times daily. 1 Inhaler 5  . buPROPion (WELLBUTRIN XL) 300 MG 24 hr tablet Take 1 tablet (300 mg total) by mouth daily. 90 tablet 1  . cetirizine (ZYRTEC) 10 MG tablet Take 10 mg by mouth daily.    . fluticasone (FLONASE) 50 MCG/ACT nasal spray Place 2 sprays into both nostrils daily. 16 g 6   No current facility-administered medications for this visit.    Allergies  Allergen Reactions  . Gramineae Pollens Rash    Pt is allergic to [employer] Reaction is HIVES  . Dust Mite Extract   . Pollen Extract       Review of Systems:  Constitutional:  No  fever, no chills, No recent illness   Cardiac: No  chest pain, No  pressure  Respiratory:  No shortness of breath. No  Cough  Gastrointestinal: No  abdominal pain, No  nausea  Psychiatric: +concerns with depression but doing well on meds, +concerns with anxiety, No sleep problems, No mood problems  Exam:  BP 132/69   Pulse 74   Wt 228 lb 1.3 oz (103.5 kg)   BMI 30.93 kg/m   Constitutional: VS see above. General Appearance: alert, well-developed, well-nourished, NAD  Eyes: Normal lids and  conjunctive, non-icteric sclera  Ears, Nose, Mouth, Throat: MMM, Normal external inspection ears/nares/mouth/lips/gums.   Neck: No masses, trachea midline.   Respiratory: Normal respiratory effort. no wheeze, no rhonchi, no rales  Cardiovascular: S1/S2 normal, no murmur, no rub/gallop auscultated. RRR.    Neurological: Normal balance/coordination. No tremor  Skin: warm, dry, intact.   Psychiatric: Normal judgment/insight. Normal mood and affect. Oriented x3. No suicidal or homicidal ideation. No thought disorder  [facility] 09/06/2017 03/27/2017 02/28/2017  Decreased Interest 1 2 2   Down, Depressed, Hopeless 1 1 2   PHQ - 2 Score 2 3 4    Altered sleeping 0 0 1  Tired, decreased energy 2 1 3   Change in appetite 0 0 1  Feeling bad or failure about yourself  1 1 3   Trouble concentrating 1 0 1  Moving slowly or fidgety/restless 1 0 1  Suicidal thoughts 0 0 1  PHQ-9 Score 7 5 15    GAD 7 : Generalized Anxiety Score 03/27/2017 02/28/2017  Nervous, Anxious, on Edge 1 2  Control/stop worrying 1 2  Worry too much - different things 1 2  Trouble relaxing 1 2  Restless 0 1  Easily annoyed or irritable 1 1  Afraid - awful might happen 0 2  Total GAD 7 Score 5 12      ASSESSMENT/PLAN:   Recurrent major depressive disorder, in full remission (HCC)  Moderate persistent reactive airway disease without complication  Elevated bilirubin - Plan: COMPLETE METABOLIC PANEL WITH GFR  Need for influenza vaccination - Plan: Flu Vaccine QUAD 6+ mos PF IM (Fluarix Quad PF)   Meds ordered this encounter  Medications  . beclomethasone (QVAR) 40 MCG/ACT inhaler    Sig: Inhale 1 puff into the lungs 2 (two) times daily.    Dispense:  1 Inhaler    Refill:  11  . buPROPion (WELLBUTRIN XL) 300 MG 24 hr tablet    Sig: Take 1 tablet (300 mg total) by mouth daily.    Dispense:  90 tablet    Refill:  3   Will let me know if any issues getting Rx - Qvar on backorder     Visit summary with medication list and pertinent instructions was printed for patient to review. All questions at time of visit were answered - patient instructed to contact office with any additional concerns. ER/RTC precautions were reviewed with the patient. Follow-up plan: Return in about 1 year (around 09/06/2018) for annual physical, sooner if needed .  Note: Total time spent 25 minutes, greater than 50% of the visit was spent face-to-face counseling and coordinating care for the following: The primary encounter diagnosis was Recurrent major depressive disorder, in full remission (HCC). Diagnoses of Moderate persistent reactive airway disease without complication, Elevated  bilirubin, and Need for influenza vaccination were also pertinent to this visit..Marland Kitchen

## 2017-11-14 MED FILL — BUPROPION HCL XL 300 MG TAB: 300 | 90 days supply | Qty: 90 | Fill #0

## 2018-03-17 ENCOUNTER — Telehealth: Payer: 59 | Admitting: Family

## 2018-03-17 DIAGNOSIS — B9689 Other specified bacterial agents as the cause of diseases classified elsewhere: Secondary | ICD-10-CM | POA: Diagnosis not present

## 2018-03-17 DIAGNOSIS — J028 Acute pharyngitis due to other specified organisms: Secondary | ICD-10-CM | POA: Diagnosis not present

## 2018-03-17 MED ORDER — AZITHROMYCIN 250 MG PO TABS: ORAL_TABLET | ORAL | 0 refills | Status: DC

## 2018-03-17 MED ORDER — PREDNISONE 5 MG PO TABS: 5.0000 mg | ORAL_TABLET | ORAL | 0 refills | Status: DC

## 2018-03-17 MED ORDER — BENZONATATE 100 MG PO CAPS: 100.0000 mg | ORAL_CAPSULE | Freq: Three times a day (TID) | ORAL | 0 refills | Status: DC | PRN

## 2018-03-17 NOTE — Progress Notes (Signed)

## 2018-06-18 ENCOUNTER — Telehealth: Payer: 59 | Admitting: Physician Assistant

## 2018-06-18 DIAGNOSIS — K121 Other forms of stomatitis: Secondary | ICD-10-CM

## 2018-06-18 MED ORDER — NYSTATIN 100000 UNIT/ML MT SUSP: 5.0000 mL | Freq: Four times a day (QID) | OROMUCOSAL | 1 refills | Status: AC

## 2018-06-18 NOTE — Progress Notes (Signed)
Hi Keithen.  I will get you a prescription for yeast stomatitis.  This is common with steroid inhalers.  If you are not getting better in a few days please make sure you get in with your PCP ASAP.  Deliah Boston, MS, PA-C 12:50 PM, 06/18/2018

## 2019-01-24 DIAGNOSIS — J019 Acute sinusitis, unspecified: Secondary | ICD-10-CM | POA: Diagnosis not present

## 2019-01-24 DIAGNOSIS — J45909 Unspecified asthma, uncomplicated: Secondary | ICD-10-CM | POA: Diagnosis not present

## 2019-04-17 ENCOUNTER — Encounter: Payer: Self-pay | Admitting: Osteopathic Medicine

## 2019-04-17 MED ORDER — BUPROPION HCL ER (XL) 300 MG PO TB24: 300.0000 mg | ORAL_TABLET | Freq: Every day | ORAL | 0 refills | Status: DC

## 2019-04-23 NOTE — Telephone Encounter (Signed)
Noted, agree overdue for f/u

## 2019-04-30 ENCOUNTER — Telehealth (INDEPENDENT_AMBULATORY_CARE_PROVIDER_SITE_OTHER): Payer: 59 | Admitting: Osteopathic Medicine

## 2019-04-30 ENCOUNTER — Encounter: Payer: Self-pay | Admitting: Osteopathic Medicine

## 2019-04-30 DIAGNOSIS — F3342 Major depressive disorder, recurrent, in full remission: Secondary | ICD-10-CM | POA: Diagnosis not present

## 2019-04-30 MED ORDER — BUPROPION HCL ER (XL) 300 MG PO TB24: 300.0000 mg | ORAL_TABLET | Freq: Every day | ORAL | 3 refills | Status: AC

## 2019-04-30 MED ORDER — BUPROPION HCL ER (XL) 300 MG PO TB24: 300.0000 mg | ORAL_TABLET | Freq: Every day | ORAL | 0 refills | Status: DC

## 2019-04-30 NOTE — Progress Notes (Signed)
Called pt, no answer. Unable to leave a vm msg - vm box not set up.

## 2019-04-30 NOTE — Progress Notes (Signed)
Virtual Visit via Video (App used: Doximity) Note  I connected with      Michael Santos on 04/30/19 at 3:45 PM by a telemedicine application and verified that I am speaking with the correct person using two identifiers.  Patient is at home I am in office   I discussed the limitations of evaluation and management by telemedicine and the availability of in person appointments. The patient expressed understanding and agreed to proceed.  History of Present Illness: Michael Santos is a 23 y.o. male who would like to discuss medication refills   Has been sometime since I have seen this patient, overall he has been doing well.  He is currently in [place], moved there with girlfriend, he is thinking of staying there permanently.  Mental health doing well, to clarify the last question of PHQ 9 patient states he is not suicidal at all, the Wellbutrin is really helping.  He thinks he misrepresented that question.     Depression screen [facility] 04/30/2019 09/06/2017 03/27/2017  Decreased Interest 1 1 2   Down, Depressed, Hopeless 1 1 1   PHQ - 2 Score 2 2 3   Altered sleeping 0 0 0  Tired, decreased energy 1 2 1   Change in appetite 0 0 0  Feeling bad or failure about yourself  2 1 1   Trouble concentrating 0 1 0  Moving slowly or fidgety/restless 0 1 0  Suicidal thoughts 1 0 0  PHQ-9 Score 6 7 5   Difficult doing work/chores Somewhat difficult - -   GAD 7 : Generalized Anxiety Score 04/30/2019 09/06/2017 03/27/2017 02/28/2017  Nervous, Anxious, on Edge 2 2 1 2   Control/stop worrying 2 2 1 2   Worry too much - different things 1 1 1 2   Trouble relaxing 1 2 1 2   Restless 0 1 0 1  Easily annoyed or irritable 0 2 1 1   Afraid - awful might happen 1 2 0 2  Total GAD 7 Score 7 12 5 12   Anxiety Difficulty Somewhat difficult - - -       Observations/Objective: There were no vitals taken for this visit. BP Readings from Last 3 Encounters:  09/06/17 132/69  04/16/17 121/79  03/27/17 134/69    Exam: Normal Speech.  NAD  Lab and Radiology Results No results found for this or any previous visit (from the past 72 hour(s)). No results found.     Assessment and Plan: 23 y.o. male with The encounter diagnosis was Recurrent major depressive disorder, in full remission (HCC).   PDMP not reviewed this encounter. No orders of the defined types were placed in this encounter.  Meds ordered this encounter  Medications  . DISCONTD: buPROPion (WELLBUTRIN XL) 300 MG 24 hr tablet    Sig: Take 1 tablet (300 mg total) by mouth daily.    Dispense:  15 tablet    Refill:  0  . buPROPion (WELLBUTRIN XL) 300 MG 24 hr tablet    Sig: Take 1 tablet (300 mg total) by mouth daily.    Dispense:  90 tablet    Refill:  3     Follow Up Instructions: Return for Patient advised to establish with a local physician, I am okay to do refills for now.    I discussed the assessment and treatment plan with the patient. The patient was provided an opportunity to ask questions and all were answered. The patient agreed with the plan and demonstrated an understanding of the instructions.   The patient was  advised to call back or seek an in-person evaluation if any new concerns, if symptoms worsen or if the condition fails to improve as anticipated.  25 minutes of non-face-to-face time was provided during this encounter.                      Historical information moved to improve visibility of documentation.  Past Medical History:  Diagnosis Date  . Allergy   . Asthma    Past Surgical History:  Procedure Laterality Date  . CLEFT PALATE REPAIR    . TYMPANOSTOMY TUBE PLACEMENT     Social History   Tobacco Use  . Smoking status: Never Smoker  . Smokeless tobacco: Never Used  Substance Use Topics  . Alcohol use: No   family history includes Alcohol abuse in his father; Asthma in his father; COPD in his father; Cancer in his maternal grandmother; Cleft palate in his father  and paternal grandfather; Depression in his mother; Diabetes in his father; Heart disease in his father; Hyperlipidemia in his father; Hypertension in his father and mother.  Medications: Current Outpatient Medications  Medication Sig Dispense Refill  . beclomethasone (QVAR) 40 MCG/ACT inhaler Inhale 1 puff into the lungs 2 (two) times daily. 1 Inhaler 11  . buPROPion (WELLBUTRIN XL) 300 MG 24 hr tablet Take 1 tablet (300 mg total) by mouth daily. 15 tablet 0  . fluticasone (FLONASE) 50 MCG/ACT nasal spray Place 2 sprays into both nostrils daily. 16 g 6  . nystatin (MYCOSTATIN) 100000 UNIT/ML suspension Take 5 mLs (500,000 Units total) by mouth 4 (four) times daily. 120 mL 1  . azithromycin (ZITHROMAX) 250 MG tablet Take 2 tabs now then 1 daily times 4 days (Patient not taking: Reported on 04/30/2019) 6 tablet 0  . benzonatate (TESSALON PERLES) 100 MG capsule Take 1-2 capsules (100-200 mg total) by mouth every 8 (eight) hours as needed for cough. (Patient not taking: Reported on 04/30/2019) 30 capsule 0  . cetirizine (ZYRTEC) 10 MG tablet Take 10 mg by mouth daily.    . montelukast (SINGULAIR) 10 MG tablet Take 10 mg by mouth daily.    . predniSONE (DELTASONE) 5 MG tablet Take 1 tablet (5 mg total) by mouth as directed. Taper 6,5,4,3,2,1 (Patient not taking: Reported on 04/30/2019) 21 tablet 0   No current facility-administered medications for this visit.    Allergies  Allergen Reactions  . Gramineae Pollens Rash    Pt is allergic to all types of GRASSES including Johnson grass. Reaction is HIVES  . Dust Mite Extract   . Pollen Extract     PDMP not reviewed this encounter. No orders of the defined types were placed in this encounter.  No orders of the defined types were placed in this encounter.
